# Patient Record
Sex: Female | Born: 1970 | Race: Black or African American | Hispanic: No | Smoking: Never smoker
Health system: Southern US, Community
[De-identification: ages and names within clinical notes are randomized; demographics above are authoritative.]

## PROBLEM LIST (undated history)

## (undated) DIAGNOSIS — F4310 Post-traumatic stress disorder, unspecified: Secondary | ICD-10-CM

## (undated) DIAGNOSIS — T7840XA Allergy, unspecified, initial encounter: Secondary | ICD-10-CM

## (undated) DIAGNOSIS — R519 Headache, unspecified: Secondary | ICD-10-CM

## (undated) DIAGNOSIS — F419 Anxiety disorder, unspecified: Secondary | ICD-10-CM

## (undated) DIAGNOSIS — D649 Anemia, unspecified: Secondary | ICD-10-CM

## (undated) DIAGNOSIS — F329 Major depressive disorder, single episode, unspecified: Secondary | ICD-10-CM

## (undated) DIAGNOSIS — F32A Depression, unspecified: Secondary | ICD-10-CM

## (undated) DIAGNOSIS — E079 Disorder of thyroid, unspecified: Secondary | ICD-10-CM

## (undated) DIAGNOSIS — R51 Headache: Secondary | ICD-10-CM

## (undated) DIAGNOSIS — D259 Leiomyoma of uterus, unspecified: Secondary | ICD-10-CM

## (undated) DIAGNOSIS — I10 Essential (primary) hypertension: Secondary | ICD-10-CM

## (undated) DIAGNOSIS — M549 Dorsalgia, unspecified: Secondary | ICD-10-CM

## (undated) HISTORY — DX: Headache, unspecified: R51.9

## (undated) HISTORY — DX: Allergy, unspecified, initial encounter: T78.40XA

## (undated) HISTORY — DX: Leiomyoma of uterus, unspecified: D25.9

## (undated) HISTORY — DX: Anxiety disorder, unspecified: F41.9

## (undated) HISTORY — PX: BREAST BIOPSY: SHX20

## (undated) HISTORY — DX: Depression, unspecified: F32.A

## (undated) HISTORY — DX: Disorder of thyroid, unspecified: E07.9

## (undated) HISTORY — DX: Anemia, unspecified: D64.9

## (undated) HISTORY — PX: ENDOMETRIAL ABLATION: SHX621

## (undated) HISTORY — DX: Headache: R51

## (undated) HISTORY — DX: Major depressive disorder, single episode, unspecified: F32.9

## (undated) HISTORY — DX: Dorsalgia, unspecified: M54.9

---

## 1992-04-02 DIAGNOSIS — M549 Dorsalgia, unspecified: Secondary | ICD-10-CM

## 1992-04-02 HISTORY — DX: Dorsalgia, unspecified: M54.9

## 1999-08-01 HISTORY — PX: THYROIDECTOMY: SHX17

## 2010-07-06 ENCOUNTER — Emergency Department (HOSPITAL_COMMUNITY): Payer: Non-veteran care

## 2010-07-06 ENCOUNTER — Emergency Department (HOSPITAL_COMMUNITY)
Admission: EM | Admit: 2010-07-06 | Discharge: 2010-07-06 | Disposition: A | Discharge status: Home / Self Care | Payer: Non-veteran care | Attending: Emergency Medicine | Admitting: Emergency Medicine

## 2010-07-06 DIAGNOSIS — I10 Essential (primary) hypertension: Secondary | ICD-10-CM | POA: Insufficient documentation

## 2010-07-06 DIAGNOSIS — M25559 Pain in unspecified hip: Secondary | ICD-10-CM | POA: Insufficient documentation

## 2010-07-06 LAB — POCT I-STAT, CHEM 8
BUN: 8 mg/dL (ref 6–23)
Calcium, Ion: 1.2 mmol/L (ref 1.12–1.32)
Chloride: 103 mEq/L (ref 96–112)
Creatinine, Ser: 0.8 mg/dL (ref 0.4–1.2)
Glucose, Bld: 102 mg/dL — ABNORMAL HIGH (ref 70–99)
HCT: 39 % (ref 36.0–46.0)
Hemoglobin: 13.3 g/dL (ref 12.0–15.0)
Potassium: 3.4 mEq/L — ABNORMAL LOW (ref 3.5–5.1)
Sodium: 140 meq/L (ref 135–145)
TCO2: 28 mmol/L (ref 0–100)

## 2010-07-06 LAB — URINALYSIS, ROUTINE W REFLEX MICROSCOPIC
Bilirubin Urine: NEGATIVE
Hgb urine dipstick: NEGATIVE
Ketones, ur: NEGATIVE mg/dL
Nitrite: NEGATIVE
Specific Gravity, Urine: 1.013 (ref 1.005–1.030)
pH: 6 (ref 5.0–8.0)

## 2010-07-06 LAB — POCT PREGNANCY, URINE: Preg Test, Ur: NEGATIVE

## 2010-07-07 ENCOUNTER — Inpatient Hospital Stay (HOSPITAL_COMMUNITY): Admission: EM | Admit: 2010-07-07 | Payer: Self-pay | Source: Home / Self Care

## 2010-07-07 ENCOUNTER — Ambulatory Visit (HOSPITAL_COMMUNITY)
Admission: EM | Admit: 2010-07-07 | Discharge: 2010-07-07 | Disposition: A | Discharge status: Home / Self Care | Payer: Non-veteran care | Attending: Emergency Medicine | Admitting: Emergency Medicine

## 2010-07-07 DIAGNOSIS — M79609 Pain in unspecified limb: Secondary | ICD-10-CM | POA: Insufficient documentation

## 2010-07-07 DIAGNOSIS — Z79899 Other long term (current) drug therapy: Secondary | ICD-10-CM | POA: Insufficient documentation

## 2010-07-07 DIAGNOSIS — I10 Essential (primary) hypertension: Secondary | ICD-10-CM | POA: Insufficient documentation

## 2013-03-26 ENCOUNTER — Emergency Department (HOSPITAL_COMMUNITY)
Admission: EM | Admit: 2013-03-26 | Discharge: 2013-03-26 | Disposition: A | Discharge status: Home / Self Care | Payer: Non-veteran care | Attending: Emergency Medicine | Admitting: Emergency Medicine

## 2013-03-26 ENCOUNTER — Encounter (HOSPITAL_COMMUNITY): Payer: Self-pay | Admitting: Emergency Medicine

## 2013-03-26 DIAGNOSIS — I10 Essential (primary) hypertension: Secondary | ICD-10-CM | POA: Insufficient documentation

## 2013-03-26 DIAGNOSIS — R509 Fever, unspecified: Secondary | ICD-10-CM | POA: Insufficient documentation

## 2013-03-26 DIAGNOSIS — Z8659 Personal history of other mental and behavioral disorders: Secondary | ICD-10-CM | POA: Insufficient documentation

## 2013-03-26 HISTORY — DX: Essential (primary) hypertension: I10

## 2013-03-26 HISTORY — DX: Post-traumatic stress disorder, unspecified: F43.10

## 2013-03-26 LAB — URINALYSIS, ROUTINE W REFLEX MICROSCOPIC
Protein, ur: NEGATIVE mg/dL
Urobilinogen, UA: 0.2 mg/dL (ref 0.0–1.0)

## 2013-03-26 LAB — URINE MICROSCOPIC-ADD ON

## 2013-03-26 NOTE — ED Notes (Signed)
Pt reports she was eating potato chips on Friday and felt like one "went down the wrong way, Im worried it went into my lungs."

## 2013-03-26 NOTE — ED Notes (Signed)
Pt in c/o fever since this afternoon with generalized body aches, denies other symptoms, pt took aleve approx 1.5 hours ago

## 2013-03-26 NOTE — ED Provider Notes (Signed)
CSN: [id number]     Arrival date & time 03/26/13  2022 History   First MD Initiated Contact with Patient 03/26/13 2053     Chief Complaint  Patient presents with  . Fever    HPI  Patient thought she was running a fever today which is a funeral. Check a temperature about 5 times today. She ultimately found one that was a 100.3. She has no other symptoms. She took some Aleve. She rechecked the temperature. It was normal. She still has no symptoms. Her father brought her here. Is not coughing. She has no dysuria. She has no sore throat. She has no URI symptoms. She has no skin rash. He is otherwise healthy  Past Medical History  Diagnosis Date  . Hypertension   . PTSD (post-traumatic stress disorder)    History reviewed. No pertinent past surgical history. History reviewed. No pertinent family history. History  Substance Use Topics  . Smoking status: Never Smoker   . Smokeless tobacco: Not on file  . Alcohol Use: Not on file   OB History   Grav Para Term Preterm Abortions TAB SAB Ect Mult Living                 Review of Systems  Constitutional: Positive for fever. Negative for chills, diaphoresis, appetite change and fatigue.  HENT: Negative for mouth sores, sore throat and trouble swallowing.   Eyes: Negative for visual disturbance.  Respiratory: Negative for cough, chest tightness, shortness of breath and wheezing.   Cardiovascular: Negative for chest pain.  Gastrointestinal: Negative for nausea, vomiting, abdominal pain, diarrhea and abdominal distention.  Endocrine: Negative for polydipsia, polyphagia and polyuria.  Genitourinary: Negative for dysuria, frequency and hematuria.  Musculoskeletal: Negative for gait problem.  Skin: Negative for color change, pallor and rash.  Neurological: Negative for dizziness, syncope, light-headedness and headaches.  Hematological: Does not bruise/bleed easily.  Psychiatric/Behavioral: Negative for behavioral problems and confusion.     Allergies  Citrus  Home Medications   Current Outpatient Rx  Name  Route  Sig  Dispense  Refill  . naproxen sodium (ANAPROX) 220 MG tablet   Oral   Take 220 mg by mouth 2 (two) times daily as needed (pain or fever).          BP 134/71  Pulse 94  Temp(Src) 99.4 F (37.4 C) (Oral)  Resp 19  SpO2 98% Physical Exam  Constitutional: She is oriented to person, place, and time. She appears well-developed and well-nourished. No distress.  HENT:  Head: Normocephalic.  Eyes: Conjunctivae are normal. Pupils are equal, round, and reactive to light. No scleral icterus.  Neck: Normal range of motion. Neck supple. No thyromegaly present.  Cardiovascular: Normal rate and regular rhythm.  Exam reveals no gallop and no friction rub.   No murmur heard. Pulmonary/Chest: Effort normal and breath sounds normal. No respiratory distress. She has no wheezes. She has no rales.  Abdominal: Soft. Bowel sounds are normal. She exhibits no distension. There is no tenderness. There is no rebound.  Musculoskeletal: Normal range of motion.  Neurological: She is alert and oriented to person, place, and time.  Skin: Skin is warm and dry. No rash noted.  Psychiatric: She has a normal mood and affect. Her behavior is normal.    ED Course  Procedures (including critical care time) Labs Review Labs Reviewed  URINALYSIS, ROUTINE W REFLEX MICROSCOPIC - Abnormal; Notable for the following:    APPearance CLOUDY (*)    Hgb urine dipstick LARGE (*)  Leukocytes, UA MODERATE (*)    All other components within normal limits  URINE MICROSCOPIC-ADD ON - Abnormal; Notable for the following:    Squamous Epithelial / LPF FEW (*)    Bacteria, UA FEW (*)    All other components within normal limits  URINE CULTURE   Imaging Review No results found.  EKG Interpretation   None       MDM   1. Fever    His mother urine. She is on her menses now. She is asymptomatic I don't think any additional studies are  indicated now. She has not traveled. There is no seasonal fluid or area now. Initial symptoms that should recheck here with primary care.    Roney Marion, MD 03/26/13 2234

## 2013-03-26 NOTE — ED Notes (Signed)
Temperature was 101.8 per pt report about 1.5 ago prior to taking aleve.

## 2013-03-28 LAB — URINE CULTURE

## 2014-09-06 ENCOUNTER — Emergency Department (HOSPITAL_COMMUNITY): Payer: Non-veteran care

## 2014-09-06 ENCOUNTER — Emergency Department (HOSPITAL_COMMUNITY)
Admission: EM | Admit: 2014-09-06 | Discharge: 2014-09-06 | Disposition: A | Discharge status: Home / Self Care | Payer: Non-veteran care | Attending: Emergency Medicine | Admitting: Emergency Medicine

## 2014-09-06 ENCOUNTER — Encounter (HOSPITAL_COMMUNITY): Payer: Self-pay | Admitting: Emergency Medicine

## 2014-09-06 DIAGNOSIS — R1031 Right lower quadrant pain: Secondary | ICD-10-CM | POA: Diagnosis present

## 2014-09-06 DIAGNOSIS — I10 Essential (primary) hypertension: Secondary | ICD-10-CM | POA: Diagnosis not present

## 2014-09-06 DIAGNOSIS — N760 Acute vaginitis: Secondary | ICD-10-CM | POA: Diagnosis not present

## 2014-09-06 DIAGNOSIS — R102 Pelvic and perineal pain: Secondary | ICD-10-CM

## 2014-09-06 DIAGNOSIS — B9689 Other specified bacterial agents as the cause of diseases classified elsewhere: Secondary | ICD-10-CM

## 2014-09-06 DIAGNOSIS — F4310 Post-traumatic stress disorder, unspecified: Secondary | ICD-10-CM | POA: Insufficient documentation

## 2014-09-06 DIAGNOSIS — Z792 Long term (current) use of antibiotics: Secondary | ICD-10-CM | POA: Insufficient documentation

## 2014-09-06 DIAGNOSIS — Z3202 Encounter for pregnancy test, result negative: Secondary | ICD-10-CM | POA: Insufficient documentation

## 2014-09-06 DIAGNOSIS — Z79899 Other long term (current) drug therapy: Secondary | ICD-10-CM | POA: Insufficient documentation

## 2014-09-06 LAB — URINALYSIS, ROUTINE W REFLEX MICROSCOPIC
BILIRUBIN URINE: NEGATIVE
GLUCOSE, UA: NEGATIVE mg/dL
Hgb urine dipstick: NEGATIVE
Ketones, ur: NEGATIVE mg/dL
Leukocytes, UA: NEGATIVE
Nitrite: NEGATIVE
PH: 5.5 (ref 5.0–8.0)
Protein, ur: NEGATIVE mg/dL
Specific Gravity, Urine: 1.01 (ref 1.005–1.030)
Urobilinogen, UA: 0.2 mg/dL (ref 0.0–1.0)

## 2014-09-06 LAB — COMPREHENSIVE METABOLIC PANEL
ALBUMIN: 3.8 g/dL (ref 3.5–5.2)
ALK PHOS: 36 U/L — AB (ref 39–117)
ALT: 32 U/L (ref 0–35)
AST: 28 U/L (ref 0–37)
Anion gap: 7 (ref 5–15)
BILIRUBIN TOTAL: 0.8 mg/dL (ref 0.3–1.2)
BUN: 8 mg/dL (ref 6–23)
CHLORIDE: 105 mmol/L (ref 96–112)
CO2: 27 mmol/L (ref 19–32)
Calcium: 9.1 mg/dL (ref 8.4–10.5)
Creatinine, Ser: 0.8 mg/dL (ref 0.50–1.10)
GFR calc Af Amer: >90 mL/min (ref >90)
GFR calc non Af Amer: 88 mL/min — ABNORMAL LOW (ref >90)
Glucose, Bld: 104 mg/dL — ABNORMAL HIGH (ref 70–99)
POTASSIUM: 3.4 mmol/L — AB (ref 3.5–5.1)
Sodium: 139 mmol/L (ref 135–145)
Total Protein: 7.1 g/dL (ref 6.0–8.3)

## 2014-09-06 LAB — CBC WITH DIFFERENTIAL/PLATELET
Basophils Absolute: 0 10*3/uL (ref 0.0–0.1)
Basophils Relative: 0 % (ref 0–1)
Eosinophils Absolute: 0.1 10*3/uL (ref 0.0–0.7)
Eosinophils Relative: 1 % (ref 0–5)
HCT: 36.3 % (ref 36.0–46.0)
Hemoglobin: 12.7 g/dL (ref 12.0–15.0)
LYMPHS ABS: 1.4 10*3/uL (ref 0.7–4.0)
LYMPHS PCT: 20 % (ref 12–46)
MCH: 29.1 pg (ref 26.0–34.0)
MCHC: 35 g/dL (ref 30.0–36.0)
MCV: 83.1 fL (ref 78.0–100.0)
MONOS PCT: 7 % (ref 3–12)
Monocytes Absolute: 0.5 10*3/uL (ref 0.1–1.0)
NEUTROS ABS: 5 10*3/uL (ref 1.7–7.7)
NEUTROS PCT: 72 % (ref 43–77)
PLATELETS: ADEQUATE 10*3/uL (ref 150–400)
RBC: 4.37 MIL/uL (ref 3.87–5.11)
RDW: 13 % (ref 11.5–15.5)
SMEAR REVIEW: ADEQUATE
WBC: 6.9 10*3/uL (ref 4.0–10.5)

## 2014-09-06 LAB — PREGNANCY, URINE: Preg Test, Ur: NEGATIVE

## 2014-09-06 LAB — LIPASE, BLOOD: Lipase: 19 U/L (ref 11–59)

## 2014-09-06 LAB — WET PREP, GENITAL
Trich, Wet Prep: NONE SEEN
WBC WET PREP: NONE SEEN
YEAST WET PREP: NONE SEEN

## 2014-09-06 MED ORDER — SODIUM CHLORIDE 0.9 % IV SOLN
INTRAVENOUS | Status: DC
  Administered 2014-09-06: 08:00:00 via INTRAVENOUS

## 2014-09-06 MED ORDER — NAPROXEN 250 MG PO TABS
250.0000 mg | ORAL_TABLET | Freq: Two times a day (BID) | ORAL | Status: DC | PRN
Start: 2014-09-06 — End: 2019-04-01

## 2014-09-06 MED ORDER — IOHEXOL 300 MG/ML  SOLN
25.0000 mL | Freq: Once | INTRAMUSCULAR | Status: AC | PRN
  Administered 2014-09-06: 25 mL via ORAL

## 2014-09-06 MED ORDER — MORPHINE SULFATE 4 MG/ML IJ SOLN
4.0000 mg | INTRAMUSCULAR | Status: AC | PRN
  Administered 2014-09-06 (×2): 4 mg via INTRAVENOUS
  Filled 2014-09-06 (×2): qty 1

## 2014-09-06 MED ORDER — HYDROCODONE-ACETAMINOPHEN 5-325 MG PO TABS: ORAL_TABLET | ORAL | Status: DC

## 2014-09-06 MED ORDER — IOHEXOL 300 MG/ML  SOLN
100.0000 mL | Freq: Once | INTRAMUSCULAR | Status: AC | PRN
  Administered 2014-09-06: 100 mL via INTRAVENOUS

## 2014-09-06 MED ORDER — ONDANSETRON HCL 4 MG/2ML IJ SOLN
4.0000 mg | INTRAMUSCULAR | Status: AC | PRN
  Administered 2014-09-06 (×2): 4 mg via INTRAVENOUS
  Filled 2014-09-06 (×2): qty 2

## 2014-09-06 MED ORDER — METRONIDAZOLE 500 MG PO TABS: 500.0000 mg | ORAL_TABLET | Freq: Two times a day (BID) | ORAL | Status: DC

## 2014-09-06 NOTE — ED Notes (Signed)
Patient c/o RLQ pain since last night and has felt "gassy" and had diarrhea last night.

## 2014-09-06 NOTE — ED Provider Notes (Signed)
CSN: [id number]     Arrival date & time 09/06/14  9030 History   First MD Initiated Contact with Patient 09/06/14 [phone]     Chief Complaint  Patient presents with  . Abdominal Pain      HPI Pt was seen at 0720.  Per pt, c/o gradual onset and persistence of constant RLQ abd "pain" since last night. Has been associated with nausea and multiple intermittent episodes of diarrhea. Describes the abd pain as "bloating" and "gassy." Pt states she experienced similar symptoms last week which lasted for several days before spontaneously resolving. Pt states "it came back again last night worse than before."  Denies vomiting, no fevers, no back pain, no rash, no CP/SOB, no black or blood in stools.       Past Medical History  Diagnosis Date  . Hypertension   . PTSD (post-traumatic stress disorder)    Past Surgical History  Procedure Laterality Date  . Thyroidectomy      History  Substance Use Topics  . Smoking status: Never Smoker   . Smokeless tobacco: Not on file  . Alcohol Use: No    Review of Systems ROS: Statement: All systems negative except as marked or noted in the HPI; Constitutional: Negative for fever and chills. ; ; Eyes: Negative for eye pain, redness and discharge. ; ; ENMT: Negative for ear pain, hoarseness, nasal congestion, sinus pressure and sore throat. ; ; Cardiovascular: Negative for chest pain, palpitations, diaphoresis, dyspnea and peripheral edema. ; ; Respiratory: Negative for cough, wheezing and stridor. ; ; Gastrointestinal: +nausea, diarrhea, abd pain. Negative for vomiting, blood in stool, hematemesis, jaundice and rectal bleeding. . ; ; Genitourinary: Negative for dysuria, flank pain and hematuria. ; ; Musculoskeletal: Negative for back pain and neck pain. Negative for swelling and trauma.; ; Skin: Negative for pruritus, rash, abrasions, blisters, bruising and skin lesion.; ; Neuro: Negative for headache, lightheadedness and neck stiffness. Negative for weakness,  altered level of consciousness , altered mental status, extremity weakness, paresthesias, involuntary movement, seizure and syncope.      Allergies  Citrus  Home Medications   Prior to Admission medications   Medication Sig Start Date End Date Taking? Authorizing Provider  Cholecalciferol (VITAMIN D) 2000 UNITS tablet Take 2,000 Units by mouth daily.    Historical Provider, MD  clindamycin (CLEOCIN T) 1 % SWAB Apply 1 application topically 2 (two) times daily.    Historical Provider, MD  ferrous sulfate 325 (65 FE) MG tablet Take 325 mg by mouth daily with breakfast.    Historical Provider, MD  FLUOCINOLONE ACETONIDE BODY 0.01 % OIL Apply 1 application topically daily.    Historical Provider, MD  fluticasone (FLONASE) 50 MCG/ACT nasal spray Place 2 sprays into the nose daily as needed for rhinitis.    Historical Provider, MD  ketoconazole (NIZORAL) 2 % shampoo Apply 1 application topically every 14 (fourteen) days.    Historical Provider, MD  ketotifen (ZADITOR) 0.025 % ophthalmic solution Place 1 drop into both eyes daily as needed (allergies).    Historical Provider, MD  Multiple Vitamin (MULTIVITAMIN WITH MINERALS) TABS tablet Take 1 tablet by mouth daily.    Historical Provider, MD  naproxen sodium (ANAPROX) 220 MG tablet Take 220 mg by mouth 2 (two) times daily as needed (pain or fever).    Historical Provider, MD  propranolol (INDERAL) 80 MG tablet Take 80 mg by mouth daily as needed (anxiety).    Historical Provider, MD  pseudoephedrine (SUDAFED) 30 MG tablet Take  30 mg by mouth every 4 (four) hours as needed for congestion.    Historical Provider, MD  SUMAtriptan (IMITREX) 25 MG tablet Take 25 mg by mouth every 2 (two) hours as needed for migraine. May repeat in 2 hours if headache persists or recurs.    Historical Provider, MD  tretinoin (RETIN-A) 0.05 % cream Apply 1 application topically at bedtime.    Historical Provider, MD  triamterene-hydrochlorothiazide (MAXZIDE) 75-50 MG per  tablet Take 0.5 tablets by mouth daily.    Historical Provider, MD  venlafaxine (EFFEXOR) 37.5 MG tablet Take 37.5 mg by mouth daily.    Historical Provider, MD   BP 138/87 mmHg  Pulse 86  Temp(Src) 98.4 F (36.9 C) (Oral)  Resp 16  Ht 5\' 9"  (1.753 m)  Wt 241 lb (109.317 kg)  BMI 35.57 kg/m2  SpO2 100%  LMP 08/15/2014 (Approximate) Physical Exam 0725: Physical examination:  Nursing notes reviewed; Vital signs and O2 SAT reviewed;  Constitutional: Well developed, Well nourished, Well hydrated, Uncomfortable appearing.; Head:  Normocephalic, atraumatic; Eyes: EOMI, PERRL, No scleral icterus; ENMT: Mouth and pharynx normal, Mucous membranes moist; Neck: Supple, Full range of motion, No lymphadenopathy; Cardiovascular: Regular rate and rhythm, No murmur, rub, or gallop; Respiratory: Breath sounds clear & equal bilaterally, No rales, rhonchi, wheezes.  Speaking full sentences with ease, Normal respiratory effort/excursion; Chest: Nontender, Movement normal; Abdomen: Soft,+RLQ tenderness to palp. Nondistended, Normal bowel sounds; Genitourinary: No CVA tenderness. Pelvic exam performed with permission of pt and female ED tech assist during exam.  External genitalia w/o lesions. Vaginal vault with thick white discharge.  Cervix w/o lesions, not friable, GC/chlam and wet prep obtained and sent to lab.  Bimanual exam w/o CMT, uterine or adnexal tenderness.; Extremities: Pulses normal, No tenderness, No edema, No calf edema or asymmetry.; Neuro: AA&Ox3, Major CN grossly intact.  Speech clear. No gross focal motor or sensory deficits in extremities.; Skin: Color normal, Warm, Dry.   ED Course  Procedures     EKG Interpretation None      MDM  MDM Reviewed: previous chart, nursing note and vitals Reviewed previous: labs Interpretation: labs and CT scan     Results for orders placed or performed during the hospital encounter of 09/06/14  Wet prep, genital  Result Value Ref Range   Yeast Wet  Prep HPF POC NONE SEEN NONE SEEN   Trich, Wet Prep NONE SEEN NONE SEEN   Clue Cells Wet Prep HPF POC RARE (A) NONE SEEN   WBC, Wet Prep HPF POC NONE SEEN NONE SEEN  CBC with Differential  Result Value Ref Range   WBC 6.9 4.0 - 10.5 K/uL   RBC 4.37 3.87 - 5.11 MIL/uL   Hemoglobin 12.7 12.0 - 15.0 g/dL   HCT 36.3 36.0 - 46.0 %   MCV 83.1 78.0 - 100.0 fL   MCH 29.1 26.0 - 34.0 pg   MCHC 35.0 30.0 - 36.0 g/dL   RDW 13.0 11.5 - 15.5 %   Platelets  150 - 400 K/uL    PLATELET CLUMPS NOTED ON SMEAR, COUNT APPEARS ADEQUATE   Neutrophils Relative % 72 43 - 77 %   Neutro Abs 5.0 1.7 - 7.7 K/uL   Lymphocytes Relative 20 12 - 46 %   Lymphs Abs 1.4 0.7 - 4.0 K/uL   Monocytes Relative 7 3 - 12 %   Monocytes Absolute 0.5 0.1 - 1.0 K/uL   Eosinophils Relative 1 0 - 5 %   Eosinophils Absolute 0.1 0.0 -  0.7 K/uL   Basophils Relative 0 0 - 1 %   Basophils Absolute 0.0 0.0 - 0.1 K/uL   Smear Review      PLATELET CLUMPS NOTED ON SMEAR, COUNT APPEARS ADEQUATE  Comprehensive metabolic panel  Result Value Ref Range   Sodium 139 135 - 145 mmol/L   Potassium 3.4 (L) 3.5 - 5.1 mmol/L   Chloride 105 96 - 112 mmol/L   CO2 27 19 - 32 mmol/L   Glucose, Bld 104 (H) 70 - 99 mg/dL   BUN 8 6 - 23 mg/dL   Creatinine, Ser 0.80 0.50 - 1.10 mg/dL   Calcium 9.1 8.4 - 10.5 mg/dL   Total Protein 7.1 6.0 - 8.3 g/dL   Albumin 3.8 3.5 - 5.2 g/dL   AST 28 0 - 37 U/L   ALT 32 0 - 35 U/L   Alkaline Phosphatase 36 (L) 39 - 117 U/L   Total Bilirubin 0.8 0.3 - 1.2 mg/dL   GFR calc non Af Amer 88 (L) >90 mL/min   GFR calc Af Amer >90 >90 mL/min   Anion gap 7 5 - 15  Lipase, blood  Result Value Ref Range   Lipase 19 11 - 59 U/L  Pregnancy, urine  Result Value Ref Range   Preg Test, Ur NEGATIVE NEGATIVE  Urinalysis, Routine w reflex microscopic  Result Value Ref Range   Color, Urine YELLOW YELLOW   APPearance CLEAR CLEAR   Specific Gravity, Urine 1.010 1.005 - 1.030   pH 5.5 5.0 - 8.0   Glucose, UA NEGATIVE  NEGATIVE mg/dL   Hgb urine dipstick NEGATIVE NEGATIVE   Bilirubin Urine NEGATIVE NEGATIVE   Ketones, ur NEGATIVE NEGATIVE mg/dL   Protein, ur NEGATIVE NEGATIVE mg/dL   Urobilinogen, UA 0.2 0.0 - 1.0 mg/dL   Nitrite NEGATIVE NEGATIVE   Leukocytes, UA NEGATIVE NEGATIVE   Ct Abdomen Pelvis W Contrast 09/06/2014   CLINICAL DATA:  Three-day history of right lower quadrant pain.  EXAM: CT ABDOMEN AND PELVIS WITH CONTRAST  TECHNIQUE: Multidetector CT imaging of the abdomen and pelvis was performed using the standard protocol following bolus administration of intravenous contrast. Oral contrast was also administered.  CONTRAST:  70mL OMNIPAQUE IOHEXOL 300 MG/ML SOLN, 162mL OMNIPAQUE IOHEXOL 300 MG/ML SOLN  COMPARISON:  None.  FINDINGS: Lung bases are clear.  No focal liver lesions are identified. The gallbladder wall is not appreciably thickened. There is no biliary duct dilatation.  Spleen, pancreas, and adrenals appear normal. Kidneys bilaterally show no mass or hydronephrosis on either side. There is no renal or ureteral calculus on either side.  In the pelvis, urinary bladder is midline with normal wall thickness. The uterus is irregular in contour. There are multiple masses within the uterus. The largest mass is in the anterior inferior fundal region measuring 3.7 x 3.7 x 3.3 cm. These changes are felt to be due to leiomyomas within the uterus. There is no pelvic mass outside of the uterus. There is a small amount of free fluid in the cul-de-sac region. The appendix region appears normal.  There is no appreciable bowel obstruction. No free air or portal venous air. There is no adenopathy or abscess in the abdomen or pelvis. There is a prominent hem azygous vein on the left to the level of the left renal vein, an anatomic variant. There is no abdominal aortic aneurysm. There are no blastic or lytic bone lesions. A small bone island in the L1 vertebral body is noted incidentally.  IMPRESSION: 1  enlarged  leiomyomatous uterus. Small amount of free fluid in cul-de-sac. Suspect recent ovarian cyst rupture.  Appendiceal region appears unremarkable. No bowel obstruction. No abscess. No renal or ureteral calculus. No hydronephrosis.   Electronically Signed   By: Lowella Grip III M.D.   On: 09/06/2014 08:39   [place] Art/ven Flow Abd Pelv Doppler 09/06/2014   CLINICAL DATA:  Pelvic pain for 3 days, primarily on the right  EXAM: TRANSABDOMINAL AND TRANSVAGINAL ULTRASOUND OF PELVIS  DOPPLER ULTRASOUND OF OVARIES  TECHNIQUE: Study was performed transabdominally to optimize pelvic field of view evaluation and transvaginally to optimize internal visceral architecture evaluation.  Color and duplex Doppler ultrasound was utilized to evaluate blood flow to the ovaries.  COMPARISON:  CT abdomen and pelvis September 06, 2014  FINDINGS: Uterus  Measurements: 11.4 x 8.1 x 6.1 cm. There is a mass arising from the right side of the uterine fundus measuring 3.2 x 2.7 x 2.4 cm. There is a mass measuring 3.8 x 3.7 x 4.1 cm arising from the leftward mid fundal region. These masses are felt to represent uterine leiomyomas. The overall echotexture of the uterus is inhomogeneous suggesting diffuse leiomyomatous change.  Endometrium  Thickness: 7 mm.  No focal abnormality visualized.  Right ovary  Measurements: 3.3 x 3.0 x 2.3 cm. Normal appearance/no adnexal mass beyond a dominant follicle measuring 1.4 x 1.3 cm.  Left ovary  Measurements: 4.1 x 2.0 x 2.7 cm. Normal appearance/no adnexal mass.  Pulsed Doppler evaluation of both ovaries demonstrates normal low-resistance arterial and venous waveforms. Peak systolic velocity for each ovary is 10 cm/sec.  Other findings  Small amount of free fluid.  IMPRESSION: Enlarged leiomyomatous uterus. No ovarian mass beyond dominant follicle right ovary. No ovarian torsion on either side. Small amount of free pelvic fluid could be indicative of recent ovarian cyst rupture.   Electronically Signed   By: Lowella Grip III M.D.   On: 09/06/2014 10:31    1140:  Will tx for BV. Pelvic pain likely d/t recent ovarian cyst rupture; tx symptomatically. Dx and testing d/w pt and family.  Questions answered.  Verb understanding, agreeable to d/c home with outpt f/u.   Francine Graven, DO 09/09/14 1646

## 2014-09-06 NOTE — Discharge Instructions (Signed)
°Emergency Department Resource Guide °1) Find a Doctor and Pay Out of Pocket °Although you won't have to find out who is covered by your insurance plan, it is a good idea to ask around and get recommendations. You will then need to call the office and see if the doctor you have chosen will accept you as a new patient and what types of options they offer for patients who are self-pay. Some doctors offer discounts or will set up payment plans for their patients who do not have insurance, but you will need to ask so you aren't surprised when you get to your appointment. ° °2) Contact Your Local Health Department °Not all health departments have doctors that can see patients for sick visits, but many do, so it is worth a call to see if yours does. If you don't know where your local health department is, you can check in your phone book. The CDC also has a tool to help you locate your state's health department, and many state websites also have listings of all of their local health departments. ° °3) Find a Walk-in Clinic °If your illness is not likely to be very severe or complicated, you may want to try a walk in clinic. These are popping up all over the country in pharmacies, drugstores, and shopping centers. They're usually staffed by nurse practitioners or physician assistants that have been trained to treat common illnesses and complaints. They're usually fairly quick and inexpensive. However, if you have serious medical issues or chronic medical problems, these are probably not your best option. ° °No Primary Care Doctor: °- Call Health Connect at  832-8000 - they can help you locate a primary care doctor that  accepts your insurance, provides certain services, etc. °- Physician Referral Service- 1-800-533-3463 ° °Chronic Pain Problems: °Organization         Address  Phone   Notes  °[facility]  (336) 297-2271 Patients need to be referred by their primary care doctor.  ° °Medication  Assistance: °Organization         Address  Phone   Notes  °[place] [facility] [address] °[place], [place] [address] (336) 641-8030 --Must be a resident of [place] °-- Must have NO insurance coverage whatsoever (no Medicaid/ Medicare, etc.) °-- The pt. MUST have a primary care doctor that directs their care regularly and follows them in the community °  °[facility]  (866) 331-1348   °[employer]  (888) 892-1162   ° °Agencies that provide inexpensive medical care: °Organization         Address  Phone   Notes  °[facility]  (336) 832-8035   °[facility]    (336) 832-7272   °[facility] [address] °[place], [place] [address] (336) 832-4777   °[facility] [address], °[place] (336) 271-4999   °[facility]    (336) 373-0678   °[facility]    (336) 272-1050   °[facility] ° [address], [place] Phone:  (336) 832-4444, Fax:  (336) 832-4440 Hours of Operation:  9 am - 6 pm, M-F.  Also accepts Medicaid/Medicare and self-pay.  °[facility] ° [address],  Phone: (336) 832-3150, Fax: (336) [phone]. Hours of Operation:  8:30 am - 5:30 pm, M-F.  Also accepts Medicaid and self-pay.  °[facility] [place] [address], [place] Phone: (336) 878-6027   °[facility] [address], [place], [place] (336)723-1848, Ext. 123 Mondays & Thursdays: 7-9 AM.  First 15 patients are seen on a first come, first serve basis. °  ° °Medicaid-accepting [place] Providers: ° °Organization         Address  Phone   Notes  °[facility] [address], [place] (336) 641-2100 Also accepts self-pay patients.  °[facility] [address], [place] ° (336) 856-9996   °[facility] [address], [place]  (336) 288-8857   °[facility] [address] [place] [address], [place] (336) 299-7000   °Veita Bland [address], [place]  ° (336) 373-1557 Only accepts [place] Access Medicaid patients after they have their name applied to their card.  ° °Self-Pay (no insurance) in [place]: ° °Organization         Address  Phone   Notes  °Sickle Cell Patients, [facility] [address], [place] (336) 832-1970   °[facility] [address], [place] (336) 832-4400   °[facility] ° [address],  (336) 992-4800   °[facility]/Dr. Osei-Bonsu ° [address] [place] [address], [place] or [address], [place] (336) [phone] Phone number for both [place] and [place] locations is the same.  °[facility] [address], [place] (336) 299-0000   °[facility] [address] [place] [address], [place] or [address] (336) 852-7530 °(336) 878-2260   °[facility] [address], [place] (336) 350-1642, phone; (336) 294-5005, fax Sees patients 1st and 3rd Saturday of every month.  Must not qualify for public or private insurance (i.e. Medicaid, Medicare, [place] Health Choice, Veterans' Benefits) • Household income should be no more than 200% of the poverty level •The clinic cannot treat you if you are pregnant or think you are pregnant • Sexually transmitted diseases are not treated at the clinic.  ° ° °Dental Care: °Organization         Address  Phone  Notes  °[place] [facility] [address],  (336) 641-6152 Accepts children up to age 21 who are enrolled in Medicaid or [place] Health Choice; pregnant women with a Medicaid card; and children who have applied for Medicaid or [place] Health Choice, but were declined, whose parents can pay a reduced fee at time of service.  °[place]  [facility] [place]  [address], [place] (336) 641-7733 Accepts children up to age 21 who are enrolled in Medicaid or  Health Choice; pregnant women with a Medicaid card; and children who have applied for Medicaid or  Health Choice, but were declined, whose parents can pay a reduced fee at time of service.  °[facility] ° [address],  (336) 641-4533 Patients are seen by appointment only. Walk-ins are not accepted. [facility] will see patients 18 years of age and older. °Monday - Tuesday (8am-5pm) °Most Wednesdays (8:30-5pm) °$30 per visit, cash only  °[facility] ° [address], [place] (336) 641-4533 Patients are seen by appointment only. Walk-ins are not accepted. [facility] will see patients 18 years of age and older. °One   Wednesday Evening (Monthly: Volunteer Based).  $30 per visit, cash only  °[facility]  (919) 537-3737 for adults; Children under age 4, call Graduate Pediatric Dentistry at ([phone]) [phone]. Children aged 4-14, please call ([phone]) [phone] to request a pediatric application. ° Dental services are provided in all areas of dental care including fillings, crowns and bridges, complete and partial dentures, implants, gum treatment, root canals, and extractions. Preventive care is also provided. Treatment is provided to both adults and children. °Patients are selected via a lottery and there is often a waiting list. °  °[facility] [address], °[place] ° (336) 763-8833 www.drcivils.[email or web address] °  °[facility] [address], [place], [place] (336)723-1848, Ext. 123 Second and Fourth Thursday of each month, opens at 6:30 AM; Clinic ends at 9 AM.  Patients are seen on a first-come first-served basis, and a limited number are seen during each clinic.  ° °[facility] ° [address], [place], [place] (336) 723-7904    Eligibility Requirements °You must have lived in [place], [place], or [place] counties for at least the last three months. °  You cannot be eligible for state or federal sponsored healthcare insurance, including Veterans Administration, Medicaid, or Medicare. °  You generally cannot be eligible for healthcare insurance through your employer.  °  How to apply: °Eligibility screenings are held every Tuesday and Wednesday afternoon from 1:00 pm until 4:00 pm. You do not need an appointment for the interview!  °[facility] [address], [place], [place] 336-631-2330   °[facility]  336-342-8273   °[place]  336-703-3100   ° [facility]  336-570-6415   ° °Behavioral Health Resources in the Community: °Intensive Outpatient Programs °Organization         Address  Phone  Notes  °[facility] [address], [place], [place] 336-878-6098   °[facility] [address], [place], [place] 336-832-9800   °[facility] [address], [place], [place] ° 336-882-2125   °[facility] [address],  °[place], [place] 1-800-853-5163 or 336-641-4981   °Substance Abuse Resources °Organization         Address  Phone  Notes  °[facility]  336-882-2125   °[facility]  336-784-9470   °[facility]  336-285-9073   °[facility]  336-845-3988   °Residential & Outpatient Substance Abuse Program  1-800-659-3381   °Psychological Services °Organization         Address  Phone  Notes  °[facility]  336- 832-9600   °[facility]  336- 378-7881   °[facility] [address], [place] 1-800-853-5163 or 336-641-4981   ° °Mobile Crisis Teams °Organization         Address  Phone  Notes  °[facility], Mobile Crisis Care Unit  1-877-626-1772   °Assertive °[facility] ° [address]  [place], [place] 336-834-9664   °Sharon DeEsch [address] °[place] 336-554-5454   ° °Self-Help/Support Groups °Organization         Address  Phone             Notes  °[facility]  - variety of support groups  336- 373-1402 Call for more information  °Narcotics Anonymous (NA), [facility] [address], °[place]  2 meetings at this location  ° °  Residential Treatment Programs Organization         Address  Phone  Notes  [facility] [address]    [place]  1-210-283-0960   [facility]  [address] [place] [address], [place], [place]   [place], [place] [phone] Admissions: 8am-3pm M-F  [facility] [place] [address]    [place], [place] [phone]   [facility] [address] [place], [place], [place]   [facility] [address],  [place], [place]   [facility] Intensive Outpatient [address], [place], [place]   [facility] ([place] [address]  [place], [place] 1-805-885-1289 or (450) 430-8695   [facility] [address] [place], [place] Accepts Medicaid  [facility] [address],  [place] 1-(248) 361-8897 Substance Abuse/Addiction Treatment   [facility]         Address  Phone  Notes  [facility]  214-835-2437   Domenic Schwab, PhD [address] [place], [place]   (318)177-9222 or 573-441-7971   [place], [place] [phone]   [facility] [address], [place], [place] [phone] Insurance/Medicaid/sponsorship through [facility] [address]                                    [place], [place] (207)196-0576 [place] [address], [place] [phone]    Dr. Adele Schilder  ([phone]   [facility] Dept. 1) [address] [place] 2) [place] 3)  [address] [place] [address], [place] [phone]  [phone]   [place] [phone] or [phone] (After Hours)      Take the prescriptions as directed.  Apply moist heat or ice to the area(s) of discomfort, for 15 minutes at a time, several times per day for the next few days.  Do not fall asleep on a heating or ice pack.  Call your regular OB/GYN doctor today to schedule a follow up appointment this week.  Return to the Emergency Department immediately if worsening.

## 2014-09-07 LAB — GC/CHLAMYDIA PROBE AMP (~~LOC~~) NOT AT ARMC
Chlamydia: NEGATIVE
Neisseria Gonorrhea: NEGATIVE

## 2016-05-08 ENCOUNTER — Encounter: Payer: Self-pay | Admitting: Nurse Practitioner

## 2016-05-08 ENCOUNTER — Other Ambulatory Visit (INDEPENDENT_AMBULATORY_CARE_PROVIDER_SITE_OTHER): Payer: Medicare HMO

## 2016-05-08 ENCOUNTER — Ambulatory Visit (INDEPENDENT_AMBULATORY_CARE_PROVIDER_SITE_OTHER): Payer: Medicare HMO | Admitting: Nurse Practitioner

## 2016-05-08 VITALS — BP 120/84 | HR 72 | Temp 98.1°F | Ht 69.0 in | Wt 247.0 lb

## 2016-05-08 DIAGNOSIS — E890 Postprocedural hypothyroidism: Secondary | ICD-10-CM

## 2016-05-08 DIAGNOSIS — E1159 Type 2 diabetes mellitus with other circulatory complications: Secondary | ICD-10-CM | POA: Insufficient documentation

## 2016-05-08 DIAGNOSIS — I10 Essential (primary) hypertension: Secondary | ICD-10-CM | POA: Diagnosis not present

## 2016-05-08 DIAGNOSIS — Z0000 Encounter for general adult medical examination without abnormal findings: Secondary | ICD-10-CM | POA: Diagnosis not present

## 2016-05-08 DIAGNOSIS — F4323 Adjustment disorder with mixed anxiety and depressed mood: Secondary | ICD-10-CM | POA: Diagnosis not present

## 2016-05-08 DIAGNOSIS — Z9889 Other specified postprocedural states: Secondary | ICD-10-CM

## 2016-05-08 DIAGNOSIS — Z800 Family history of malignant neoplasm of digestive organs: Secondary | ICD-10-CM | POA: Insufficient documentation

## 2016-05-08 LAB — CBC WITH DIFFERENTIAL/PLATELET
Basophils Absolute: 0 10*3/uL (ref 0.0–0.1)
Basophils Relative: 0.7 % (ref 0.0–3.0)
Eosinophils Absolute: 0.1 10*3/uL (ref 0.0–0.7)
Eosinophils Relative: 2.2 % (ref 0.0–5.0)
HCT: 36.4 % (ref 36.0–46.0)
Hemoglobin: 12.4 g/dL (ref 12.0–15.0)
LYMPHS ABS: 1.4 10*3/uL (ref 0.7–4.0)
Lymphocytes Relative: 35.3 % (ref 12.0–46.0)
MCHC: 34 g/dL (ref 30.0–36.0)
MCV: 81.6 fl (ref 78.0–100.0)
MONO ABS: 0.3 10*3/uL (ref 0.1–1.0)
Monocytes Relative: 6.9 % (ref 3.0–12.0)
NEUTROS PCT: 54.9 % (ref 43.0–77.0)
Neutro Abs: 2.3 10*3/uL (ref 1.4–7.7)
Platelets: 151 10*3/uL (ref 150.0–400.0)
RBC: 4.46 Mil/uL (ref 3.87–5.11)
RDW: 14.5 % (ref 11.5–15.5)
WBC: 4.1 10*3/uL (ref 4.0–10.5)

## 2016-05-08 LAB — LIPID PANEL
CHOL/HDL RATIO: 2
Cholesterol: 203 mg/dL — ABNORMAL HIGH (ref 0–200)
HDL: 82.7 mg/dL (ref >39.00)
LDL Cholesterol: 107 mg/dL — ABNORMAL HIGH (ref 0–99)
NonHDL: 120.69
Triglycerides: 69 mg/dL (ref 0.0–149.0)
VLDL: 13.8 mg/dL (ref 0.0–40.0)

## 2016-05-08 LAB — COMPREHENSIVE METABOLIC PANEL
ALT: 13 U/L (ref 0–35)
AST: 15 U/L (ref 0–37)
Albumin: 3.8 g/dL (ref 3.5–5.2)
Alkaline Phosphatase: 39 U/L (ref 39–117)
BUN: 11 mg/dL (ref 6–23)
CO2: 31 meq/L (ref 19–32)
Calcium: 9.1 mg/dL (ref 8.4–10.5)
Chloride: 103 mEq/L (ref 96–112)
Creatinine, Ser: 0.78 mg/dL (ref 0.40–1.20)
GFR: 101.87 mL/min (ref >60.00)
GLUCOSE: 87 mg/dL (ref 70–99)
POTASSIUM: 3.4 meq/L — AB (ref 3.5–5.1)
SODIUM: 140 meq/L (ref 135–145)
TOTAL PROTEIN: 7 g/dL (ref 6.0–8.3)
Total Bilirubin: 0.5 mg/dL (ref 0.2–1.2)

## 2016-05-08 LAB — TSH: TSH: 1.51 u[IU]/mL (ref 0.35–4.50)

## 2016-05-08 LAB — HEMOGLOBIN A1C: Hgb A1c MFr Bld: 5.2 % (ref 4.6–6.5)

## 2016-05-08 MED ORDER — POTASSIUM CHLORIDE ER 10 MEQ PO TBCR: 10.0000 meq | EXTENDED_RELEASE_TABLET | Freq: Every day | ORAL | 3 refills | Status: DC

## 2016-05-08 NOTE — Patient Instructions (Signed)
Call 911 or Mobile crisis [phone] if develop suicide ideation or panic attack.
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
## 2016-05-08 NOTE — Progress Notes (Signed)
Pre visit review using our clinic review tool, if applicable. No additional management support is needed unless otherwise documented below in the visit note. 

## 2016-05-14 ENCOUNTER — Telehealth: Payer: Self-pay | Admitting: Nurse Practitioner

## 2016-05-14 NOTE — Telephone Encounter (Signed)
Spoke with pt,she said bloating/gas might be coming from something else, she will continue to monitor. Pt will take her potassium as direct along with food rich in potassium.

## 2016-05-14 NOTE — Telephone Encounter (Signed)
Patient is calling back. Rachel Bo had gave her the results back on the 8th. She understood she did pick up the medications. The only thing is that the medication is messing her stomach up. She would like to know if there is something she could do about that. Please follow up, Thank you

## 2016-05-30 ENCOUNTER — Encounter: Payer: Self-pay | Admitting: Gastroenterology

## 2016-06-05 ENCOUNTER — Encounter: Payer: Self-pay | Admitting: Nurse Practitioner

## 2016-06-05 ENCOUNTER — Ambulatory Visit (INDEPENDENT_AMBULATORY_CARE_PROVIDER_SITE_OTHER): Payer: Medicare HMO | Admitting: Nurse Practitioner

## 2016-06-05 ENCOUNTER — Other Ambulatory Visit (INDEPENDENT_AMBULATORY_CARE_PROVIDER_SITE_OTHER): Payer: Medicare HMO

## 2016-06-05 VITALS — BP 132/84 | HR 92 | Temp 98.0°F | Ht 69.0 in | Wt 251.0 lb

## 2016-06-05 DIAGNOSIS — I10 Essential (primary) hypertension: Secondary | ICD-10-CM

## 2016-06-05 DIAGNOSIS — F4323 Adjustment disorder with mixed anxiety and depressed mood: Secondary | ICD-10-CM

## 2016-06-05 DIAGNOSIS — E876 Hypokalemia: Secondary | ICD-10-CM

## 2016-06-05 DIAGNOSIS — R922 Inconclusive mammogram: Secondary | ICD-10-CM | POA: Insufficient documentation

## 2016-06-05 DIAGNOSIS — L659 Nonscarring hair loss, unspecified: Secondary | ICD-10-CM | POA: Insufficient documentation

## 2016-06-05 LAB — BASIC METABOLIC PANEL
BUN: 11 mg/dL (ref 6–23)
CHLORIDE: 104 meq/L (ref 96–112)
CO2: 33 mEq/L — ABNORMAL HIGH (ref 19–32)
Calcium: 8.6 mg/dL (ref 8.4–10.5)
Creatinine, Ser: 0.78 mg/dL (ref 0.40–1.20)
GFR: 101.84 mL/min (ref >60.00)
GLUCOSE: 93 mg/dL (ref 70–99)
Potassium: 3.6 mEq/L (ref 3.5–5.1)
Sodium: 141 mEq/L (ref 135–145)

## 2016-06-05 NOTE — Progress Notes (Signed)
Subjective:  Patient ID: Lisa Boyer, female    DOB: March 18, 1971  Age: 46 y.o. MRN: [id number]  CC: Follow-up (1 mo fu/alopecia?)   HPI HTN:  Stable, completed potassium supple as prescribed.  Adjustment disorder: Still attending support group. Describes mood as improved compared to 16month ago. Denies any SI or Hi.  Alopecia: [facility] with [facility]. Ongoing steriod injections. [facility]ending [facility]. She thinks this might be caused by an autoimmune disorder, but not sure. Does not know her family history since she was adopted. Just recently reconnected with biological family but has not been able to inquire about FH at this time.  Outpatient Medications Prior to Visit  Medication Sig Dispense Refill  . Cholecalciferol (VITAMIN D) 2000 UNITS tablet Take 2,000 Units by mouth 2 (two) times daily.     . ferrous sulfate 325 (65 FE) MG tablet Take 325 mg by mouth daily as needed (during menstrual cycle).     . fluticasone (FLONASE) 50 MCG/ACT nasal spray Place 2 sprays into the nose daily as needed for rhinitis.    Marland Kitchen ketoconazole (NIZORAL) 2 % shampoo Apply 1 application topically every 14 (fourteen) days.    Marland Kitchen ketotifen (ZADITOR) 0.025 % ophthalmic solution Place 1 drop into both eyes daily as needed (allergies).    . Multiple Vitamin (MULTIVITAMIN WITH MINERALS) TABS tablet Take 1 tablet by mouth daily.    . naproxen (NAPROSYN) 250 MG tablet Take 1 tablet (250 mg total) by mouth 2 (two) times daily as needed for mild pain or moderate pain (take with food). 14 tablet 0  . tretinoin (RETIN-A) 0.05 % cream Apply 1 application topically once a week.     . triamterene-hydrochlorothiazide (MAXZIDE) 75-50 MG per tablet Take 0.5 tablets by mouth daily.    . potassium chloride (K-DUR) 10 MEQ tablet Take 1 tablet (10 mEq total) by mouth daily. 30 tablet 3  . metroNIDAZOLE (FLAGYL) 500 MG tablet Take 1 tablet (500 mg total) by mouth 2 (two) times  daily. (Patient not taking: Reported on 06/05/2016) 14 tablet 0   No facility-administered medications prior to visit.     ROS See HPI  BMP Latest Ref Rng & Units 06/05/2016 05/08/2016 09/06/2014  Glucose 70 - 99 mg/dL 93 87 104(H)  BUN 6 - 23 mg/dL 11 11 8   Creatinine 0.40 - 1.20 mg/dL 0.78 0.78 0.80  Sodium 135 - 145 mEq/L 141 140 139  Potassium 3.5 - 5.1 mEq/L 3.6 3.4(L) 3.4(L)  Chloride 96 - 112 mEq/L 104 103 105  CO2 19 - 32 mEq/L 33(H) 31 27  Calcium 8.4 - 10.5 mg/dL 8.6 9.1 9.1   Objective:  BP 132/84   Pulse 92   Temp 98 F (36.7 C)   Ht 5\' 9"  (1.753 m)   Wt 251 lb (113.9 kg)   LMP 05/05/2016 (Approximate)   SpO2 97%   BMI 37.07 kg/m   BP Readings from Last 3 Encounters:  06/05/16 132/84  05/08/16 120/84  09/06/14 118/82    Wt Readings from Last 3 Encounters:  06/05/16 251 lb (113.9 kg)  05/08/16 247 lb (112 kg)  09/06/14 241 lb (109.3 kg)    Physical Exam  Constitutional: She is oriented to person, place, and time. No distress.  HENT:  Right Ear: External ear normal.  Left Ear: External ear normal.  Nose: Nose normal.  Mouth/Throat: No oropharyngeal exudate.  Neck: Normal range of motion. Neck supple.  Cardiovascular: Normal rate, regular rhythm  and normal heart sounds.   Pulmonary/Chest: Effort normal.  Musculoskeletal: Normal range of motion. She exhibits no edema.  Neurological: She is alert and oriented to person, place, and time.  Skin: Skin is warm and dry.  Psychiatric: She has a normal mood and affect. Her behavior is normal.  Vitals reviewed.   Lab Results  Component Value Date   WBC 4.1 05/08/2016   HGB 12.4 05/08/2016   HCT 36.4 05/08/2016   PLT 151.0 05/08/2016   GLUCOSE 93 06/05/2016   CHOL 203 (H) 05/08/2016   TRIG 69.0 05/08/2016   HDL 82.70 05/08/2016   LDLCALC 107 (H) 05/08/2016   ALT 13 05/08/2016   AST 15 05/08/2016   NA 141 06/05/2016   K 3.6 06/05/2016   CL 104 06/05/2016   CREATININE 0.78 06/05/2016   BUN 11  06/05/2016   CO2 33 (H) 06/05/2016   TSH 1.51 05/08/2016   HGBA1C 5.2 05/08/2016    US Transvaginal Non-ob  Result Date: 09/06/2014 CLINICAL DATA:  Pelvic pain for 3 days, primarily on the right EXAM: TRANSABDOMINAL AND TRANSVAGINAL ULTRASOUND OF PELVIS DOPPLER ULTRASOUND OF OVARIES TECHNIQUE: Study was performed transabdominally to optimize pelvic field of view evaluation and transvaginally to optimize internal visceral architecture evaluation. Color and duplex Doppler ultrasound was utilized to evaluate blood flow to the ovaries. COMPARISON:  CT abdomen and pelvis September 06, 2014 FINDINGS: Uterus Measurements: 11.4 x 8.1 x 6.1 cm. There is a mass arising from the right side of the uterine fundus measuring 3.2 x 2.7 x 2.4 cm. There is a mass measuring 3.8 x 3.7 x 4.1 cm arising from the leftward mid fundal region. These masses are felt to represent uterine leiomyomas. The overall echotexture of the uterus is inhomogeneous suggesting diffuse leiomyomatous change. Endometrium Thickness: 7 mm.  No focal abnormality visualized. Right ovary Measurements: 3.3 x 3.0 x 2.3 cm. Normal appearance/no adnexal mass beyond a dominant follicle measuring 1.4 x 1.3 cm. Left ovary Measurements: 4.1 x 2.0 x 2.7 cm. Normal appearance/no adnexal mass. Pulsed Doppler evaluation of both ovaries demonstrates normal low-resistance arterial and venous waveforms. Peak systolic velocity for each ovary is 10 cm/sec. Other findings Small amount of free fluid. IMPRESSION: Enlarged leiomyomatous uterus. No ovarian mass beyond dominant follicle right ovary. No ovarian torsion on either side. Small amount of free pelvic fluid could be indicative of recent ovarian cyst rupture. Electronically Signed   By: Lowella Grip III M.D.   On: 09/06/2014 10:31   US Pelvis Complete  Result Date: 09/06/2014 CLINICAL DATA:  Pelvic pain for 3 days, primarily on the right EXAM: TRANSABDOMINAL AND TRANSVAGINAL ULTRASOUND OF PELVIS DOPPLER ULTRASOUND OF  OVARIES TECHNIQUE: Study was performed transabdominally to optimize pelvic field of view evaluation and transvaginally to optimize internal visceral architecture evaluation. Color and duplex Doppler ultrasound was utilized to evaluate blood flow to the ovaries. COMPARISON:  CT abdomen and pelvis September 06, 2014 FINDINGS: Uterus Measurements: 11.4 x 8.1 x 6.1 cm. There is a mass arising from the right side of the uterine fundus measuring 3.2 x 2.7 x 2.4 cm. There is a mass measuring 3.8 x 3.7 x 4.1 cm arising from the leftward mid fundal region. These masses are felt to represent uterine leiomyomas. The overall echotexture of the uterus is inhomogeneous suggesting diffuse leiomyomatous change. Endometrium Thickness: 7 mm.  No focal abnormality visualized. Right ovary Measurements: 3.3 x 3.0 x 2.3 cm. Normal appearance/no adnexal mass beyond a dominant follicle measuring 1.4 x 1.3 cm. Left ovary  Measurements: 4.1 x 2.0 x 2.7 cm. Normal appearance/no adnexal mass. Pulsed Doppler evaluation of both ovaries demonstrates normal low-resistance arterial and venous waveforms. Peak systolic velocity for each ovary is 10 cm/sec. Other findings Small amount of free fluid. IMPRESSION: Enlarged leiomyomatous uterus. No ovarian mass beyond dominant follicle right ovary. No ovarian torsion on either side. Small amount of free pelvic fluid could be indicative of recent ovarian cyst rupture. Electronically Signed   By: Lowella Grip III M.D.   On: 09/06/2014 10:31   Ct Abdomen Pelvis W Contrast  Result Date: 09/06/2014 CLINICAL DATA:  Three-day history of right lower quadrant pain. EXAM: CT ABDOMEN AND PELVIS WITH CONTRAST TECHNIQUE: Multidetector CT imaging of the abdomen and pelvis was performed using the standard protocol following bolus administration of intravenous contrast. Oral contrast was also administered. CONTRAST:  23mL OMNIPAQUE IOHEXOL 300 MG/ML SOLN, 152mL OMNIPAQUE IOHEXOL 300 MG/ML SOLN COMPARISON:  None.  FINDINGS: Lung bases are clear. No focal liver lesions are identified. The gallbladder wall is not appreciably thickened. There is no biliary duct dilatation. Spleen, pancreas, and adrenals appear normal. Kidneys bilaterally show no mass or hydronephrosis on either side. There is no renal or ureteral calculus on either side. In the pelvis, urinary bladder is midline with normal wall thickness. The uterus is irregular in contour. There are multiple masses within the uterus. The largest mass is in the anterior inferior fundal region measuring 3.7 x 3.7 x 3.3 cm. These changes are felt to be due to leiomyomas within the uterus. There is no pelvic mass outside of the uterus. There is a small amount of free fluid in the cul-de-sac region. The appendix region appears normal. There is no appreciable bowel obstruction. No free air or portal venous air. There is no adenopathy or abscess in the abdomen or pelvis. There is a prominent hem azygous vein on the left to the level of the left renal vein, an anatomic variant. There is no abdominal aortic aneurysm. There are no blastic or lytic bone lesions. A small bone island in the L1 vertebral body is noted incidentally. IMPRESSION: 1 enlarged leiomyomatous uterus. Small amount of free fluid in cul-de-sac. Suspect recent ovarian cyst rupture. Appendiceal region appears unremarkable. No bowel obstruction. No abscess. No renal or ureteral calculus. No hydronephrosis. Electronically Signed   By: Lowella Grip III M.D.   On: 09/06/2014 08:39   [place] Art/ven Flow Abd Pelv Doppler  Result Date: 09/06/2014 CLINICAL DATA:  Pelvic pain for 3 days, primarily on the right EXAM: TRANSABDOMINAL AND TRANSVAGINAL ULTRASOUND OF PELVIS DOPPLER ULTRASOUND OF OVARIES TECHNIQUE: Study was performed transabdominally to optimize pelvic field of view evaluation and transvaginally to optimize internal visceral architecture evaluation. Color and duplex Doppler ultrasound was utilized to evaluate  blood flow to the ovaries. COMPARISON:  CT abdomen and pelvis September 06, 2014 FINDINGS: Uterus Measurements: 11.4 x 8.1 x 6.1 cm. There is a mass arising from the right side of the uterine fundus measuring 3.2 x 2.7 x 2.4 cm. There is a mass measuring 3.8 x 3.7 x 4.1 cm arising from the leftward mid fundal region. These masses are felt to represent uterine leiomyomas. The overall echotexture of the uterus is inhomogeneous suggesting diffuse leiomyomatous change. Endometrium Thickness: 7 mm.  No focal abnormality visualized. Right ovary Measurements: 3.3 x 3.0 x 2.3 cm. Normal appearance/no adnexal mass beyond a dominant follicle measuring 1.4 x 1.3 cm. Left ovary Measurements: 4.1 x 2.0 x 2.7 cm. Normal appearance/no adnexal mass. Pulsed Doppler evaluation  of both ovaries demonstrates normal low-resistance arterial and venous waveforms. Peak systolic velocity for each ovary is 10 cm/sec. Other findings Small amount of free fluid. IMPRESSION: Enlarged leiomyomatous uterus. No ovarian mass beyond dominant follicle right ovary. No ovarian torsion on either side. Small amount of free pelvic fluid could be indicative of recent ovarian cyst rupture. Electronically Signed   By: Lowella Grip III M.D.   On: 09/06/2014 10:31    Assessment & Plan:   Lisa Boyer was seen today for follow-up.  Diagnoses and all orders for this visit:  HTN (hypertension), benign -     Basic Metabolic Panel (BMET); Future  Hypokalemia -     Basic Metabolic Panel (BMET); Future  Adjustment disorder with mixed anxiety and depressed mood   I have discontinued Ms. Thone's metroNIDAZOLE and potassium chloride. I am also having her maintain her Vitamin D, triamterene-hydrochlorothiazide, multivitamin with minerals, ferrous sulfate, ketotifen, fluticasone, ketoconazole, tretinoin, and naproxen.  No orders of the defined types were placed in this encounter.   Follow-up: Return in about 3 months (around 09/03/2016) for HTN.  Wilfred Lacy, NP

## 2016-06-05 NOTE — Patient Instructions (Addendum)
Avoid tight braids, hot combs and relaxers.  During next Ov, inquire about Family history of any autoimmune disease.

## 2016-06-05 NOTE — Assessment & Plan Note (Addendum)
Managed by dermatologist with [facility].

## 2016-06-05 NOTE — Progress Notes (Signed)
Pre visit review using our clinic review tool, if applicable. No additional management support is needed unless otherwise documented below in the visit note. 

## 2016-06-05 NOTE — Assessment & Plan Note (Signed)
Last mamm[facility] with CAD 02/2016 done by [facility]: bilateral scattered fibroglandular densities, benign left breast mass. Recommended repeat in 1year.

## 2016-06-05 NOTE — Assessment & Plan Note (Signed)
Managed by psychologist with [facility]. Attending support group called "mindfulness" at this time. Improved mental status and life's outlook compared to 27month ago.

## 2016-07-10 ENCOUNTER — Encounter: Payer: Self-pay | Admitting: Gastroenterology

## 2016-07-10 ENCOUNTER — Ambulatory Visit (INDEPENDENT_AMBULATORY_CARE_PROVIDER_SITE_OTHER): Payer: Medicare HMO | Admitting: Gastroenterology

## 2016-07-10 ENCOUNTER — Encounter (INDEPENDENT_AMBULATORY_CARE_PROVIDER_SITE_OTHER): Payer: Self-pay

## 2016-07-10 VITALS — BP 134/92 | HR 88 | Ht 68.25 in | Wt 248.4 lb

## 2016-07-10 DIAGNOSIS — R140 Abdominal distension (gaseous): Secondary | ICD-10-CM

## 2016-07-10 DIAGNOSIS — R194 Change in bowel habit: Secondary | ICD-10-CM | POA: Diagnosis not present

## 2016-07-10 DIAGNOSIS — R198 Other specified symptoms and signs involving the digestive system and abdomen: Secondary | ICD-10-CM

## 2016-07-10 MED ORDER — NA SULFATE-K SULFATE-MG SULF 17.5-3.13-1.6 GM/177ML PO SOLN: 1.0000 | Freq: Once | ORAL | 0 refills | Status: AC

## 2016-07-10 NOTE — Patient Instructions (Signed)
If you are age 47 or older, your body mass index should be between 23-30. Your Body mass index is 37.49 kg/m. If this is out of the aforementioned range listed, please consider follow up with your Primary Care Provider.  If you are age 62 or younger, your body mass index should be between 19-25. Your Body mass index is 37.49 kg/m. If this is out of the aformentioned range listed, please consider follow up with your Primary Care Provider.   You have been scheduled for a colonoscopy. Please follow written instructions given to you at your visit today.  Please pick up your prep supplies at the pharmacy within the next 1-3 days. If you use inhalers (even only as needed), please bring them with you on the day of your procedure. Your physician has requested that you go to www.startemmi.com and enter the access code given to you at your visit today. This web site gives a general overview about your procedure. However, you should still follow specific instructions given to you by our office regarding your preparation for the procedure.   Thank you for choosing me and [facility].  Dr. Madelon Lips

## 2016-07-10 NOTE — Progress Notes (Signed)
[facility] Consult Note:  History: Lisa Boyer 07/10/2016  Referring physician: Wilfred Lacy, NP  Reason for consult/chief complaint: Gas (belching after eating); change in bowel habits (constipation and then at times have to go right then); and Bloated   Subjective  HPI:  This is a 46 year old woman referred by primary care for change in bowel habits and abdominal bloating with gas. She reports that for about a year she has had some intermittent lower abdominal bloating and sometimes visible distention after meals. Her bowel habits tend alternate: She denies rectal bleeding or weight loss. She was concerned because she recently learned some medical history from her biologic mother, which included breast and skin cancer. There is no known family history of colon cancer. She was cared for at the [facility] for years and felt that various symptoms that she might have more often attributed to PTSD. She is just concerned about her ongoing symptoms and wants to make sure is no harmful cause.  ROS:  Review of Systems  Constitutional: Negative for appetite change and unexpected weight change.  HENT: Negative for mouth sores and voice change.   Eyes: Negative for pain and redness.  Respiratory: Negative for cough and shortness of breath.   Cardiovascular: Negative for chest pain and palpitations.  Genitourinary: Negative for dysuria and hematuria.  Musculoskeletal: Negative for arthralgias and myalgias.  Skin: Negative for pallor and rash.  Neurological: Negative for weakness and headaches.  Hematological: Negative for adenopathy.     Past Medical History: Past Medical History:  Diagnosis Date  . Allergy   . Anemia   . Anxiety   . Back pain 04/1992   chronic back pain-diagnose from [place]  . Depression   . Headache    frequent headache  . Hypertension   . PTSD (post-traumatic stress disorder)   . Thyroid disease   . Uterine fibroid      Past Surgical History: Past  Surgical History:  Procedure Laterality Date  . BREAST BIOPSY Bilateral 1888/1191/1994/2001   remove growth from both side breast  . ENDOMETRIAL ABLATION    . THYROIDECTOMY  08/1999   partial     Family History: Family History  Problem Relation Age of Onset  . Adopted: Yes  . Breast cancer Mother     and 2 other types but don't know what kind  . Skin cancer Mother     Social History: Social History   Social History  . Marital status: Widowed    Spouse name: N/A  . Number of children: 1  . Years of education: N/A   Occupational History  . Education officer, museum    Social History Main Topics  . Smoking status: Never Smoker  . Smokeless tobacco: Never Used  . Alcohol use Yes     Comment: social-twice a year  . Drug use: No  . Sexual activity: Not Asked   Other Topics Concern  . None   Social History Narrative  . None    Allergies: Allergies  Allergen Reactions  . Citrus     Tongue burns and goes numb  . Latex Other (See Comments)    Discolor-stay for while    Outpatient Meds: Current Outpatient Prescriptions  Medication Sig Dispense Refill  . Cholecalciferol (VITAMIN D) 2000 UNITS tablet Take 2,000 Units by mouth 2 (two) times daily.     . ferrous sulfate 325 (65 FE) MG tablet Take 325 mg by mouth daily as needed (during menstrual cycle).     . fluticasone (FLONASE) 50  MCG/ACT nasal spray Place 2 sprays into the nose daily as needed for rhinitis.    Marland Kitchen ketoconazole (NIZORAL) 2 % shampoo Apply 1 application topically every 14 (fourteen) days.    Marland Kitchen ketotifen (ZADITOR) 0.025 % ophthalmic solution Place 1 drop into both eyes daily as needed (allergies).    . Multiple Vitamin (MULTIVITAMIN WITH MINERALS) TABS tablet Take 1 tablet by mouth daily.    . naproxen (NAPROSYN) 250 MG tablet Take 1 tablet (250 mg total) by mouth 2 (two) times daily as needed for mild pain or moderate pain (take with food). 14 tablet 0  . potassium chloride (K-DUR) 10 MEQ tablet Take 1 tablet by  mouth daily.    Marland Kitchen tretinoin (RETIN-A) 0.05 % cream Apply 1 application topically once a week.     . triamterene-hydrochlorothiazide (MAXZIDE) 75-50 MG per tablet Take 0.5 tablets by mouth daily.    . Na Sulfate-K Sulfate-Mg Sulf 17.5-3.13-1.6 GM/180ML SOLN Take 1 kit by mouth once. 354 mL 0   No current facility-administered medications for this visit.       ___________________________________________________________________ Objective   Exam:  BP (!) 134/92 (BP Location: Left Arm, Patient Position: Sitting, Cuff Size: Large)   Pulse 88   Ht 5' 8.25" (1.734 m) Comment: height measured without shoes  Wt 248 lb 6 oz (112.7 kg)   LMP 06/22/2016   BMI 37.49 kg/m    General: this is a(n) Well-appearing woman who looks younger than stated age   Eyes: sclera anicteric, no redness  ENT: oral mucosa moist without lesions, no cervical or supraclavicular lymphadenopathy, good dentition  CV: RRR without murmur, S1/S2, no JVD, no peripheral edema  Resp: clear to auscultation bilaterally, normal RR and effort noted  GI: soft, no tenderness, with active bowel sounds. No guarding or palpable organomegaly noted.  Skin; warm and dry, no rash or jaundice noted  Neuro: awake, alert and oriented x 3. Normal gross motor function and fluent speech  Labs:  CBC Latest Ref Rng & Units 05/08/2016 09/06/2014 07/06/2010  WBC 4.0 - 10.5 K/uL 4.1 6.9 -  Hemoglobin 12.0 - 15.0 g/dL 12.4 12.7 13.3  Hematocrit 36.0 - 46.0 % 36.4 36.3 39.0  Platelets 150.0 - 400.0 K/uL 151.0 PLATELET CLUMPS NOTED ON SMEAR, COUNT APPEARS ADEQUATE -     CT abdomen and pelvis April 2016 shows uterine fibroids, no other pathology  Assessment: Encounter Diagnoses  Name Primary?  . Change in bowel function Yes  . Abdominal bloating     The symptoms sound likely benign such as IBS. However, given their duration and her concerns, I have opted to pursue a colonoscopy to rule out any other pathology. She is agreeable.  The  benefits and risks of the planned procedure were described in detail with the patient or (when appropriate) their health care proxy.  Risks were outlined as including, but not limited to, bleeding, infection, perforation, adverse medication reaction leading to cardiac or pulmonary decompensation, or pancreatitis (if ERCP).  The limitation of incomplete mucosal visualization was also discussed.  No guarantees or warranties were given.   Thank you for the courtesy of this consult.  Please call me with any questions or concerns.  Nelida Meuse III  CC: Wilfred Lacy, NP

## 2016-07-31 ENCOUNTER — Encounter: Payer: Self-pay | Admitting: Gastroenterology

## 2016-08-14 ENCOUNTER — Encounter: Payer: Medicare HMO | Admitting: Gastroenterology

## 2016-09-03 ENCOUNTER — Ambulatory Visit: Payer: Medicare HMO | Admitting: Nurse Practitioner

## 2016-09-09 ENCOUNTER — Ambulatory Visit: Payer: Medicare HMO | Admitting: Psychiatry

## 2016-09-10 IMAGING — CT CT ABD-PELV W/ CM
2 of 5 series · 16 of 46 positions shown, 18 images · IV contrast (Omnipaque 300)
Comparison: None.

CLINICAL DATA: Three-day history of right lower quadrant pain.

EXAM:
CT ABDOMEN AND PELVIS WITH CONTRAST
TECHNIQUE: Multidetector CT imaging of the abdomen and pelvis was performed
using the standard protocol following bolus administration of
intravenous contrast. Oral contrast was also administered.
CONTRAST:  25mL OMNIPAQUE IOHEXOL 300 MG/ML SOLN, 100mL OMNIPAQUE
IOHEXOL 300 MG/ML SOLN

[Series 2: abd_pel_with 5.0 b40f · axial · 0.78mm/px · z∈[-431,-6]mm · 13 of 96 slices shown, 15 images]
[im 6/96  soft-tissue]
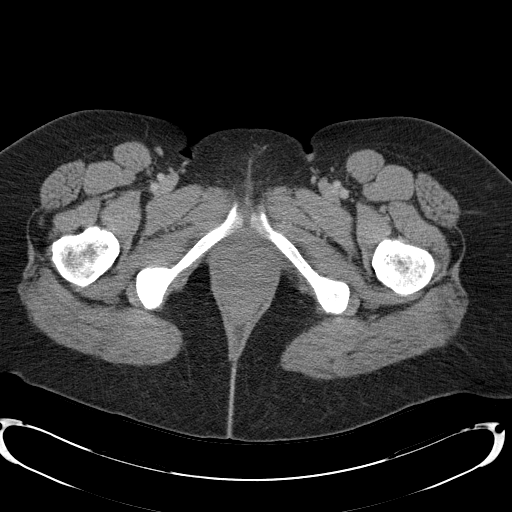
[im 6/96  bone]
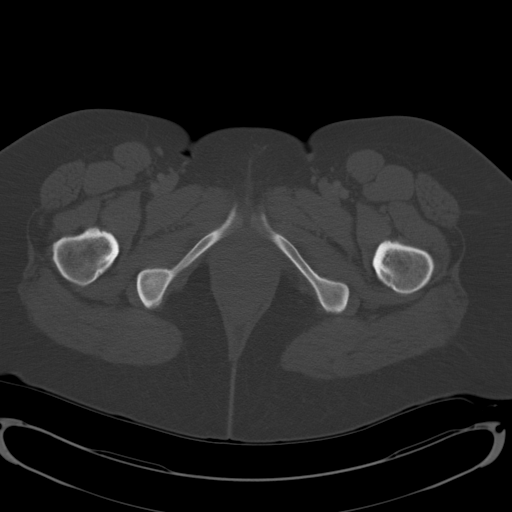
[im 16/96  soft-tissue]
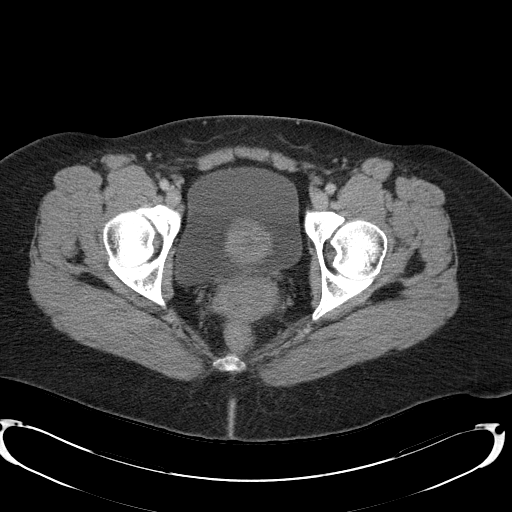
[im 21/96  soft-tissue]
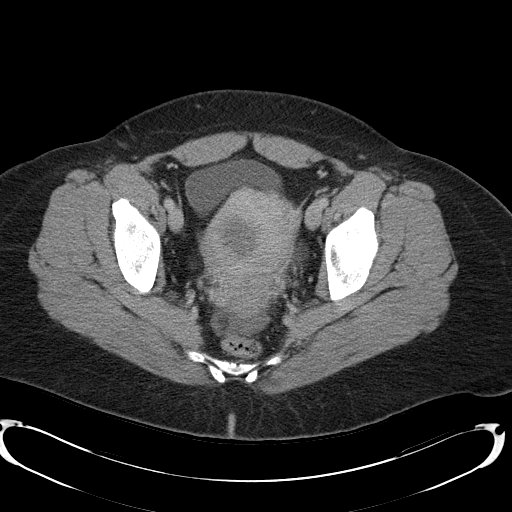
[im 26/96  soft-tissue]
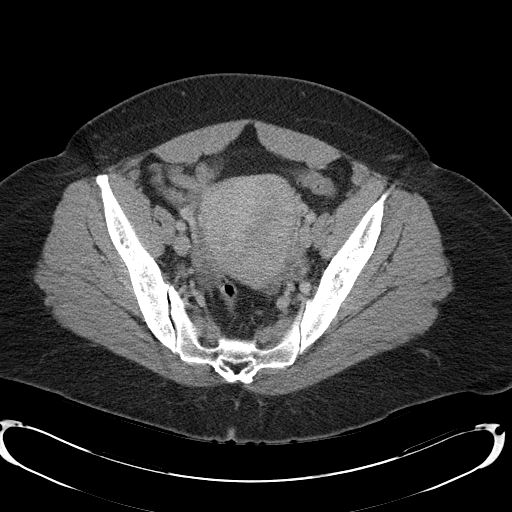
[im 36/96  soft-tissue]
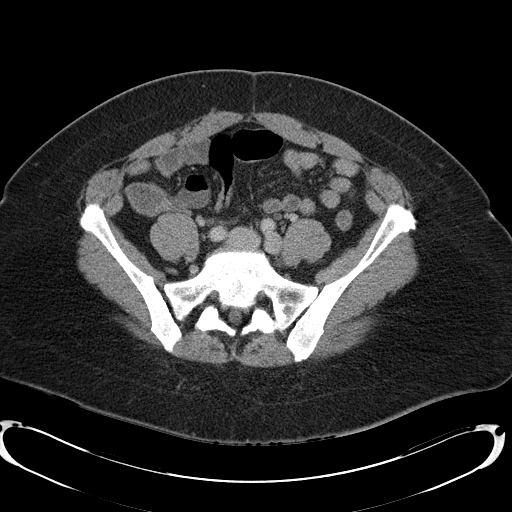
[im 41/96  soft-tissue]
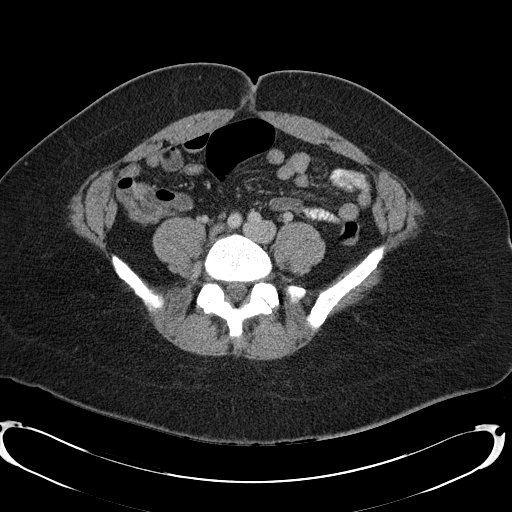
[im 51/96  soft-tissue]
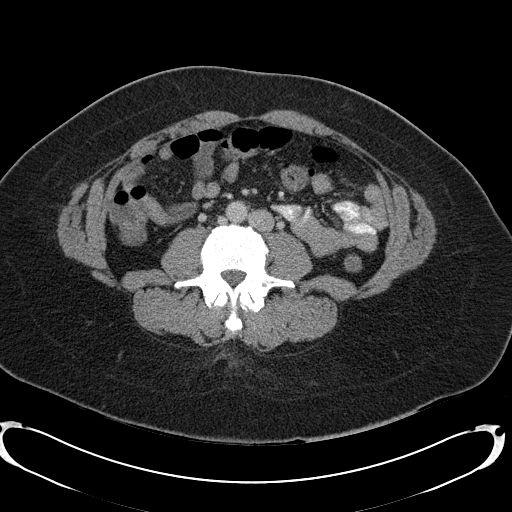
[im 56/96  soft-tissue]
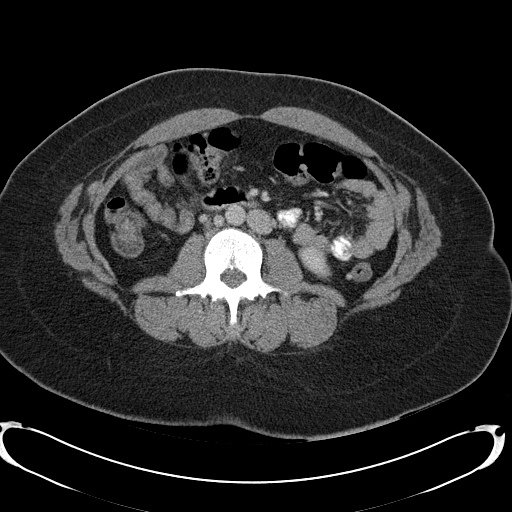
[im 61/96  soft-tissue]
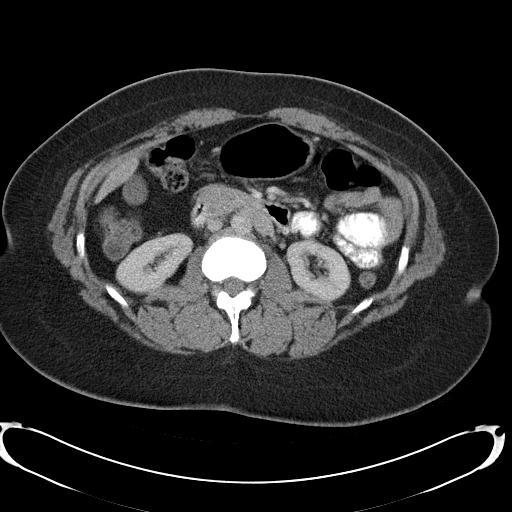
[im 61/96  bone]
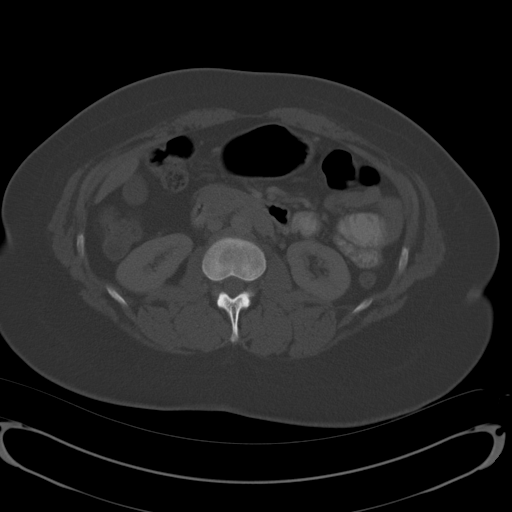
[im 71/96  soft-tissue]
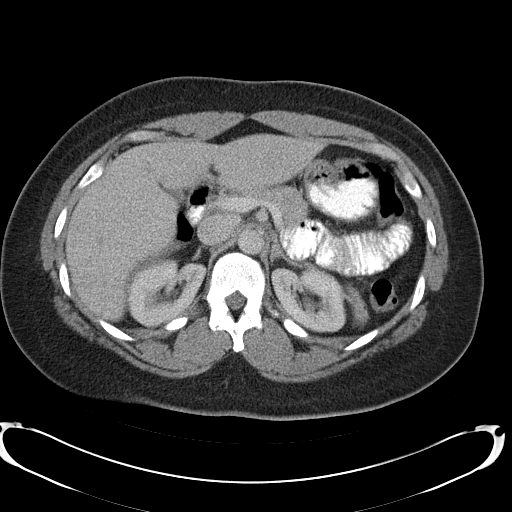
[im 76/96  soft-tissue]
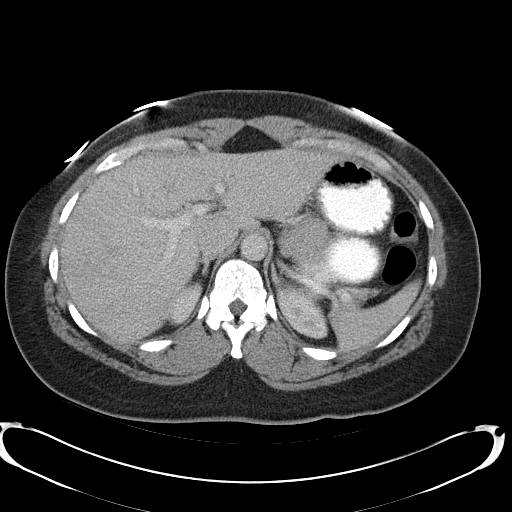
[im 81/96  soft-tissue]
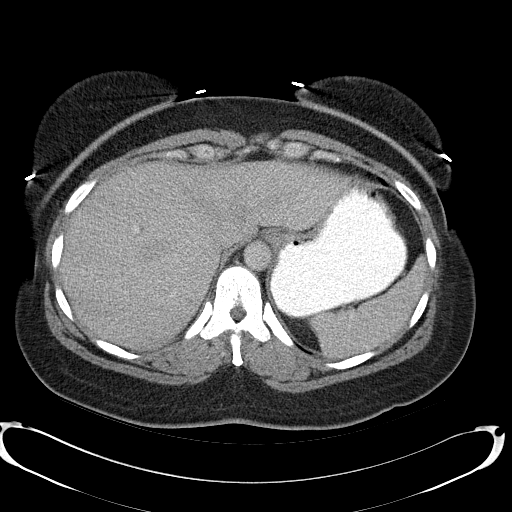
[im 91/96  soft-tissue]
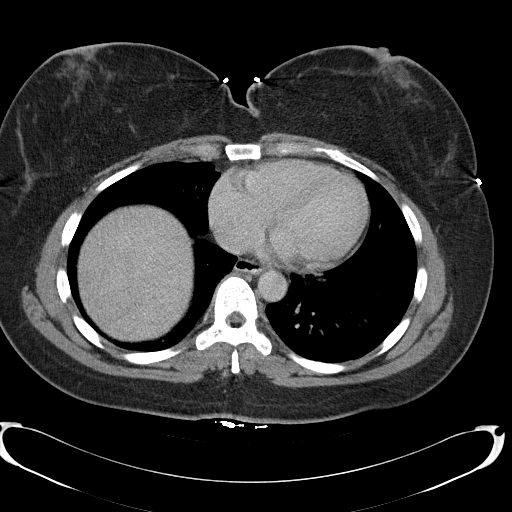

[Series 3: abd_pel_with 3.0 spo cor · coronal · 0.80mm/px · 3 of 76 slices shown]
[im 26/76  soft-tissue]
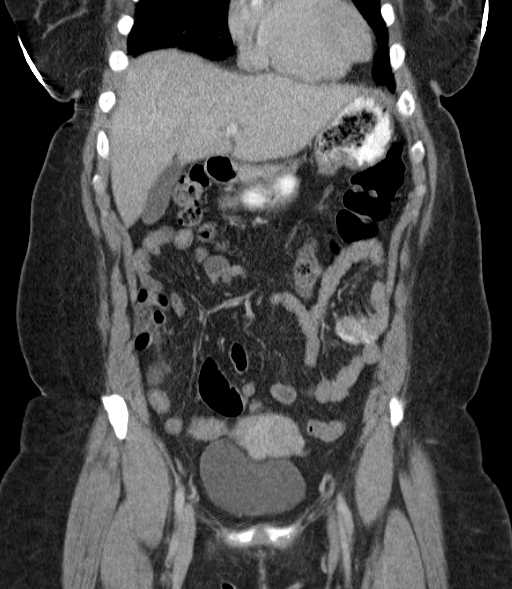
[im 34/76  soft-tissue]
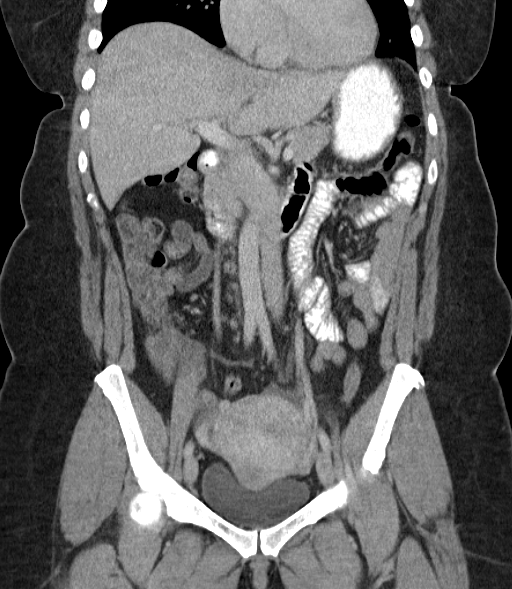
[im 42/76  soft-tissue]
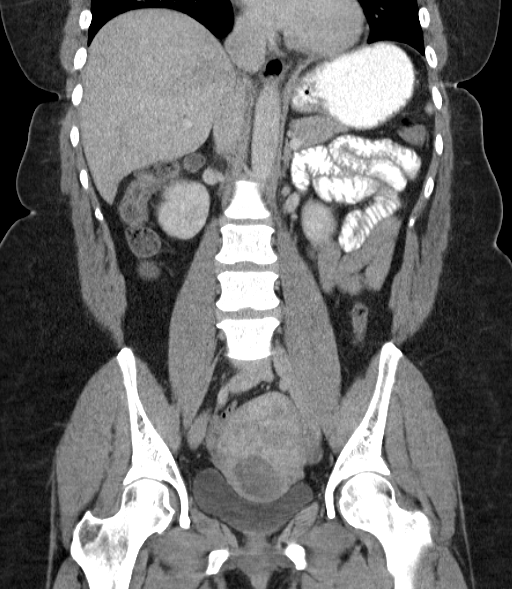

[16 of 46 positions shown; findings below may reference images not displayed]

FINDINGS: Lung bases are clear.

No focal liver lesions are identified. The gallbladder wall is not
appreciably thickened. There is no biliary duct dilatation.

Spleen, pancreas, and adrenals appear normal. Kidneys bilaterally
show no mass or hydronephrosis on either side. There is no renal or
ureteral calculus on either side.

In the pelvis, urinary bladder is midline with normal wall
thickness. The uterus is irregular in contour. There are multiple
masses within the uterus. The largest mass is in the anterior
inferior fundal region measuring 3.7 x 3.7 x 3.3 cm. These changes
are felt to be due to leiomyomas within the uterus. There is no
pelvic mass outside of the uterus. There is a small amount of free
fluid in the cul-de-sac region. The appendix region appears normal.

There is no appreciable bowel obstruction. No free air or portal
venous air. There is no adenopathy or abscess in the abdomen or
pelvis. There is a prominent Bettini azygous vein on the left to the
level of the left renal vein, an anatomic variant. There is no
abdominal aortic aneurysm. There are no blastic or lytic bone
lesions. A small bone island in the L1 vertebral body is noted
incidentally.
IMPRESSION: 1 enlarged leiomyomatous uterus. Small amount of free fluid in
cul-de-sac. Suspect recent ovarian cyst rupture.

Appendiceal region appears unremarkable. No bowel obstruction. No
abscess. No renal or ureteral calculus. No hydronephrosis.

## 2017-08-13 HISTORY — PX: ABDOMINAL HYSTERECTOMY: SHX81

## 2017-09-29 ENCOUNTER — Ambulatory Visit: Payer: Self-pay | Admitting: Sports Medicine

## 2017-10-20 ENCOUNTER — Ambulatory Visit (INDEPENDENT_AMBULATORY_CARE_PROVIDER_SITE_OTHER): Payer: Medicare HMO

## 2017-10-20 ENCOUNTER — Other Ambulatory Visit: Payer: Self-pay | Admitting: Sports Medicine

## 2017-10-20 ENCOUNTER — Encounter: Payer: Self-pay | Admitting: Sports Medicine

## 2017-10-20 ENCOUNTER — Ambulatory Visit (INDEPENDENT_AMBULATORY_CARE_PROVIDER_SITE_OTHER): Payer: Medicare HMO | Admitting: Sports Medicine

## 2017-10-20 VITALS — BP 117/76 | HR 86 | Resp 16

## 2017-10-20 DIAGNOSIS — M79671 Pain in right foot: Secondary | ICD-10-CM | POA: Diagnosis not present

## 2017-10-20 DIAGNOSIS — M79672 Pain in left foot: Secondary | ICD-10-CM | POA: Diagnosis not present

## 2017-10-20 DIAGNOSIS — M722 Plantar fascial fibromatosis: Secondary | ICD-10-CM

## 2017-10-20 DIAGNOSIS — M2141 Flat foot [pes planus] (acquired), right foot: Secondary | ICD-10-CM | POA: Diagnosis not present

## 2017-10-20 DIAGNOSIS — M729 Fibroblastic disorder, unspecified: Secondary | ICD-10-CM | POA: Diagnosis not present

## 2017-10-20 DIAGNOSIS — M2142 Flat foot [pes planus] (acquired), left foot: Secondary | ICD-10-CM

## 2017-10-20 NOTE — Patient Instructions (Signed)

## 2017-10-20 NOTE — Progress Notes (Addendum)
Subjective: Lisa Boyer is a 47 y.o. female patient presents to office with complaint of moderatearch pain on the left and right. Patient admits to post static dyskinesia for years since time in service. Patient has treated this problem with custom insoles with a little relief, due for a set from [place] this week. Pain is worse in feet with extensive walking or standing. Sharp pins and throbbing pain and only comfortable shoes are tennis shoes. 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 Admits that she suffers with memory issues and that sometimes she get anxious about her medical conditions. Reports that she is on a fluid medication that has helped the swelling in her ankles. Denies any other pedal complaints.   Review of Systems  Musculoskeletal: Positive for joint pain.  All other systems reviewed and are negative.    Patient Active Problem List   Diagnosis Date Noted  . Alopecia 06/05/2016  . Dense breast tissue on mammogram 06/05/2016  . Encounter for preventative adult health care examination 05/08/2016  . HTN (hypertension), benign 05/08/2016  . Adjustment disorder with mixed anxiety and depressed mood 05/08/2016    Current Outpatient Medications on File Prior to Visit  Medication Sig Dispense Refill  . Cholecalciferol (VITAMIN D) 2000 UNITS tablet Take 2,000 Units by mouth 2 (two) times daily.     . ferrous sulfate 325 (65 FE) MG tablet Take 325 mg by mouth daily as needed (during menstrual cycle).     . fluticasone (FLONASE) 50 MCG/ACT nasal spray Place 2 sprays into the nose daily as needed for rhinitis.    Marland Kitchen ketoconazole (NIZORAL) 2 % shampoo Apply 1 application topically every 14 (fourteen) days.    Marland Kitchen ketotifen (ZADITOR) 0.025 % ophthalmic solution Place 1 drop into both eyes daily as needed (allergies).    . Multiple Vitamin (MULTIVITAMIN WITH MINERALS) TABS  tablet Take 1 tablet by mouth daily.    . naproxen (NAPROSYN) 250 MG tablet Take 1 tablet (250 mg total) by mouth 2 (two) times daily as needed for mild pain or moderate pain (take with food). 14 tablet 0  . potassium chloride (K-DUR) 10 MEQ tablet Take 1 tablet by mouth daily.    Marland Kitchen tretinoin (RETIN-A) 0.05 % cream Apply 1 application topically once a week.     . triamterene-hydrochlorothiazide (MAXZIDE) 75-50 MG per tablet Take 0.5 tablets by mouth daily.     No current facility-administered medications on file prior to visit.     Allergies  Allergen Reactions  . Citrus     Tongue burns and goes numb  . Latex Other (See Comments)    Discolor-stay for while Adhesive tape - leaves a mark on skin    Objective: Physical Exam General: The patient is alert and oriented x3 in no acute distress.  Dermatology: Skin is warm, dry and supple bilateral lower extremities. Nails 1-10 are polished. There is no erythema, edema, no eccymosis, no open lesions present. Integument is otherwise unremarkable.  Vascular: Dorsalis Pedis pulse and Posterior Tibial pulse are 2/4 bilateral. Capillary fill time is immediate to all digits.  Neurological: Grossly intact to light touch with an achilles reflex of +2/5 and a  negative Tinel's sign bilateral.  Musculoskeletal: Subjective pain to palpation at arches and distally along plantar fascia bilateral. No pain with compression of calcaneus bilateral. No pain with tuning fork to calcaneus  bilateral. No pain with calf compression bilateral. There is decreased Ankle joint range of motion bilateral. All other joints range of motion within normal limits bilateral. Strength 5/5 in all groups bilateral.   Xray, Right and left foot:  Normal osseous mineralization. Joint spaces preserved except at midfoot where there is breach supportive of pes planus. No fracture/dislocation/boney destruction. Calcaneal spur present with mild thickening of plantar fascia. No other soft  tissue abnormalities or radiopaque foreign bodies.   Assessment and Plan: Problem List Items Addressed This Visit    None    Visit Diagnoses    Foot pain, bilateral    -  Primary   Relevant Orders   DG Foot Complete Left (Completed)   Fasciitis       Pes planus of both feet          -Complete examination performed.  -Xrays reviewed -Discussed with patient in detail the condition of plantar fasciitis with pes planus, how this occurs and general treatment options. Explained both conservative and surgical treatments.  -Recommend OTC topical pain cream .  -Advised patient to get orthotics from [facility] -Recommended good supportive shoes  -Explained and dispensed to patient daily stretching exercises. -Recommend patient to ice affected area 1-2x daily. -Patient to return to office in 4 weeks for follow up or sooner if problems or questions arise. IF NO IMPROVEMENT WILL CONSIDER INJECTION OR MEDICATION THERAPY.   Landis Martins, DPM

## 2017-11-17 ENCOUNTER — Ambulatory Visit: Payer: Non-veteran care | Admitting: Sports Medicine

## 2017-11-17 ENCOUNTER — Encounter: Payer: Self-pay | Admitting: Sports Medicine

## 2017-11-17 ENCOUNTER — Ambulatory Visit (INDEPENDENT_AMBULATORY_CARE_PROVIDER_SITE_OTHER): Payer: Medicare HMO | Admitting: Sports Medicine

## 2017-11-17 DIAGNOSIS — B351 Tinea unguium: Secondary | ICD-10-CM

## 2017-11-17 DIAGNOSIS — M2141 Flat foot [pes planus] (acquired), right foot: Secondary | ICD-10-CM

## 2017-11-17 DIAGNOSIS — M2142 Flat foot [pes planus] (acquired), left foot: Secondary | ICD-10-CM

## 2017-11-17 DIAGNOSIS — M729 Fibroblastic disorder, unspecified: Secondary | ICD-10-CM

## 2017-11-17 DIAGNOSIS — M79672 Pain in left foot: Secondary | ICD-10-CM

## 2017-11-17 DIAGNOSIS — M79671 Pain in right foot: Secondary | ICD-10-CM

## 2017-11-17 NOTE — Progress Notes (Signed)
Subjective: Lisa Boyer is a 47 y.o. female patient returns to office for follow up evaluation of bilateral foot pain; reports that her topical icy hot helps, and got her orthotics from [facility] an hour ago. Patient states her feet still hurt but she feels like she is making some progress. Admits that she is concerned with her nails especially bilateral 1st, 4-5 toes. Denies any other pedal complaints.    Patient Active Problem List   Diagnosis Date Noted  . Alopecia 06/05/2016  . Dense breast tissue on mammogram 06/05/2016  . Encounter for preventative adult health care examination 05/08/2016  . HTN (hypertension), benign 05/08/2016  . Adjustment disorder with mixed anxiety and depressed mood 05/08/2016    Current Outpatient Medications on File Prior to Visit  Medication Sig Dispense Refill  . Cholecalciferol (VITAMIN D) 2000 UNITS tablet Take 2,000 Units by mouth 2 (two) times daily.     . ferrous sulfate 325 (65 FE) MG tablet Take 325 mg by mouth daily as needed (during menstrual cycle).     . fluticasone (FLONASE) 50 MCG/ACT nasal spray Place 2 sprays into the nose daily as needed for rhinitis.    Marland Kitchen ketoconazole (NIZORAL) 2 % shampoo Apply 1 application topically every 14 (fourteen) days.    Marland Kitchen ketotifen (ZADITOR) 0.025 % ophthalmic solution Place 1 drop into both eyes daily as needed (allergies).    . Multiple Vitamin (MULTIVITAMIN WITH MINERALS) TABS tablet Take 1 tablet by mouth daily.    . naproxen (NAPROSYN) 250 MG tablet Take 1 tablet (250 mg total) by mouth 2 (two) times daily as needed for mild pain or moderate pain (take with food). 14 tablet 0  . potassium chloride (K-DUR) 10 MEQ tablet Take 1 tablet by mouth daily.    Marland Kitchen tretinoin (RETIN-A) 0.05 % cream Apply 1 application topically once a week.     . triamterene-hydrochlorothiazide (MAXZIDE) 75-50 MG per tablet Take 0.5 tablets by mouth daily.     No current facility-administered medications on file prior to visit.      Allergies  Allergen Reactions  . Citrus     Tongue burns and goes numb  . Latex Other (See Comments)    Discolor-stay for while Adhesive tape - leaves a mark on skin    Objective: Physical Exam General: The patient is alert and oriented x3 in no acute distress.  Dermatology: Skin is warm, dry and supple bilateral lower extremities. Nails 1-10 are thickened with subungal debris and possible fungus. There is no erythema, edema, no eccymosis, no open lesions present. Integument is otherwise unremarkable.  Vascular: Dorsalis Pedis pulse and Posterior Tibial pulse are 2/4 bilateral. Capillary fill time is immediate to all digits. Trace edema to ankles.   Neurological: Grossly intact to light touch with an achilles reflex of +2/5 and a  negative Tinel's sign bilateral.  Musculoskeletal: Subjective pain to palpation at arches and along plantar fascia bilateral that is a little better. No pain with compression of calcaneus bilateral. No pain with tuning fork to calcaneus bilateral. No pain with calf compression bilateral. There is decreased Ankle joint range of motion bilateral. All other joints range of motion within normal limits bilateral. Strength 5/5 in all groups bilateral.   Assessment and Plan: Problem List Items Addressed This Visit    None    Visit Diagnoses    Nail fungus    -  Primary   Relevant Orders   Culture, fungus without smear   Fasciitis  Pes planus of both feet       Foot pain, bilateral          -Complete examination performed.  -Re-Discussed with patient in detail the condition of plantar fasciitis with pes planus, how this occurs and general treatment options. Explained both conservative and surgical treatments.  -Continue with OTC topical pain cream .  -Continue with orthotics from [facility] continue with good supportive shoes  -Continue with daily stretching exercises. -Recommend patient to continue to ice affected area 1-2x  daily. -Fungal culture was obtained from hard nail plate itself from all toes and sent to [facility] -Patient to return to office in 4 weeks for fungal culture results Landis Martins, DPM

## 2017-12-22 ENCOUNTER — Telehealth: Payer: Self-pay | Admitting: Sports Medicine

## 2017-12-22 ENCOUNTER — Telehealth: Payer: Self-pay | Admitting: *Deleted

## 2017-12-22 ENCOUNTER — Ambulatory Visit: Payer: Non-veteran care | Admitting: Sports Medicine

## 2017-12-22 NOTE — Telephone Encounter (Signed)
Lisa Boyer Let her know that her fungal culture is negative, The thickness and discoloration to her nails is likely coming from pressure from shoes or the way she walks or stands which can be helped by orthotics of which she just recently got from [facility]. -Dr. Chauncey Cruel

## 2017-12-22 NOTE — Telephone Encounter (Signed)
Pt was wondering if she can get her test results over the phones, shes having an anxiety atttack and unable to keep her appt.

## 2017-12-23 NOTE — Telephone Encounter (Signed)
Left message requesting call back to discuss lab results.

## 2017-12-23 NOTE — Telephone Encounter (Signed)
Pt called for results and I informed her of Dr. Leeanne Rio review of results and orders.

## 2018-01-21 NOTE — Telephone Encounter (Signed)
Entered in error

## 2018-03-26 ENCOUNTER — Encounter: Payer: Self-pay | Admitting: Nurse Practitioner

## 2018-03-26 ENCOUNTER — Ambulatory Visit (INDEPENDENT_AMBULATORY_CARE_PROVIDER_SITE_OTHER): Payer: Medicare HMO | Admitting: Nurse Practitioner

## 2018-03-26 VITALS — BP 146/102 | HR 67 | Temp 97.8°F | Ht 68.25 in | Wt 238.2 lb

## 2018-03-26 DIAGNOSIS — D500 Iron deficiency anemia secondary to blood loss (chronic): Secondary | ICD-10-CM

## 2018-03-26 DIAGNOSIS — I10 Essential (primary) hypertension: Secondary | ICD-10-CM

## 2018-03-26 DIAGNOSIS — F4323 Adjustment disorder with mixed anxiety and depressed mood: Secondary | ICD-10-CM | POA: Diagnosis not present

## 2018-03-26 LAB — CBC WITH DIFFERENTIAL/PLATELET
Basophils Absolute: 42 cells/uL (ref 0–200)
Basophils Relative: 0.8 %
EOS ABS: 88 {cells}/uL (ref 15–500)
Eosinophils Relative: 1.7 %
HCT: 38.9 % (ref 35.0–45.0)
HEMOGLOBIN: 13.3 g/dL (ref 11.7–15.5)
Lymphs Abs: 1607 cells/uL (ref 850–3900)
MCH: 28.7 pg (ref 27.0–33.0)
MCHC: 34.2 g/dL (ref 32.0–36.0)
MCV: 83.8 fL (ref 80.0–100.0)
MONOS PCT: 8.3 %
MPV: 11.5 fL (ref 7.5–12.5)
NEUTROS ABS: 3032 {cells}/uL (ref 1500–7800)
Neutrophils Relative %: 58.3 %
Platelets: 217 10*3/uL (ref 140–400)
RBC: 4.64 10*6/uL (ref 3.80–5.10)
RDW: 13.3 % (ref 11.0–15.0)
Total Lymphocyte: 30.9 %
WBC: 5.2 10*3/uL (ref 3.8–10.8)
WBCMIX: 432 {cells}/uL (ref 200–950)

## 2018-03-26 LAB — IRON,TIBC AND FERRITIN PANEL
%SAT: 27 % (calc) (ref 16–45)
Ferritin: 26 ng/mL (ref 16–232)
Iron: 93 ug/dL (ref 40–190)
TIBC: 346 ug/dL (ref 250–450)

## 2018-03-26 LAB — COMPREHENSIVE METABOLIC PANEL
AG RATIO: 1.5 (calc) (ref 1.0–2.5)
ALT: 18 U/L (ref 6–29)
AST: 15 U/L (ref 10–35)
Albumin: 4.1 g/dL (ref 3.6–5.1)
Alkaline phosphatase (APISO): 49 U/L (ref 33–115)
BUN: 14 mg/dL (ref 7–25)
CO2: 30 mmol/L (ref 20–32)
CREATININE: 0.94 mg/dL (ref 0.50–1.10)
Calcium: 9.3 mg/dL (ref 8.6–10.2)
Chloride: 102 mmol/L (ref 98–110)
GLUCOSE: 78 mg/dL (ref 65–99)
Globulin: 2.7 g/dL (calc) (ref 1.9–3.7)
Potassium: 3.7 mmol/L (ref 3.5–5.3)
SODIUM: 140 mmol/L (ref 135–146)
TOTAL PROTEIN: 6.8 g/dL (ref 6.1–8.1)
Total Bilirubin: 0.7 mg/dL (ref 0.2–1.2)

## 2018-03-26 LAB — TSH: TSH: 2.01 mIU/L

## 2018-03-26 NOTE — Progress Notes (Signed)
Subjective:  Patient ID: Lisa Boyer, female    DOB: 1971/01/27  Age: 47 y.o. MRN: [id number]  CC: Follow-up (follow up on BP/)   HPI HTN: Elevated today. Use of maxzide and metoprolol. Metoprolol succinte 25mg  1/2tab daily, prescribed by [facility] provider. Reports waxing and waning BP readings. Associated with headache. BP Readings from Last 3 Encounters:  03/26/18 (!) 146/102  10/20/17 117/76  07/10/16 (!) 134/92   Anxiety, Depression and PTSD: Managed by [facility] psychiatry.  Anemia: Hx of endometriosis and uterine fibroid. S/p uterine ablation.  Reviewed past Medical, Social and Family history today.  Outpatient Medications Prior to Visit  Medication Sig Dispense Refill  . Cholecalciferol (VITAMIN D) 2000 UNITS tablet Take 2,000 Units by mouth 2 (two) times daily.     Marland Kitchen ketoconazole (NIZORAL) 2 % shampoo Apply 1 application topically every 14 (fourteen) days.    Marland Kitchen ketotifen (ZADITOR) 0.025 % ophthalmic solution Place 1 drop into both eyes daily as needed (allergies).    . Multiple Vitamin (MULTIVITAMIN WITH MINERALS) TABS tablet Take 1 tablet by mouth daily.    Marland Kitchen tretinoin (RETIN-A) 0.05 % cream Apply 1 application topically once a week.     . triamterene-hydrochlorothiazide (MAXZIDE) 75-50 MG per tablet Take 0.5 tablets by mouth daily.    . ferrous sulfate 325 (65 FE) MG tablet Take 325 mg by mouth daily as needed (during menstrual cycle).     . fluticasone (FLONASE) 50 MCG/ACT nasal spray Place 2 sprays into the nose daily as needed for rhinitis.    . naproxen (NAPROSYN) 250 MG tablet Take 1 tablet (250 mg total) by mouth 2 (two) times daily as needed for mild pain or moderate pain (take with food). (Patient not taking: Reported on 03/26/2018) 14 tablet 0  . potassium chloride (K-DUR) 10 MEQ tablet Take 1 tablet by mouth daily.     No facility-administered medications prior to visit.     ROS See HPI  Objective:  BP (!) 146/102   Pulse 67   Temp 97.8 F (36.6 C) (Oral)    Ht 5' 8.25" (1.734 m)   Wt 238 lb 3.2 oz (108 kg)   SpO2 98%   BMI 35.95 kg/m   BP Readings from Last 3 Encounters:  03/26/18 (!) 146/102  10/20/17 117/76  07/10/16 (!) 134/92    Wt Readings from Last 3 Encounters:  03/26/18 238 lb 3.2 oz (108 kg)  07/10/16 248 lb 6 oz (112.7 kg)  06/05/16 251 lb (113.9 kg)   Physical Exam  Constitutional: She is oriented to person, place, and time. She appears well-developed and well-nourished.  Neck: No JVD present.  Cardiovascular: Normal rate and regular rhythm.  Pulmonary/Chest: Effort normal and breath sounds normal.  Musculoskeletal: She exhibits no edema.  Neurological: She is alert and oriented to person, place, and time.  Psychiatric: She has a normal mood and affect. Her behavior is normal.  Vitals reviewed.  Lab Results  Component Value Date   WBC 5.2 03/26/2018   HGB 13.3 03/26/2018   HCT 38.9 03/26/2018   PLT 217 03/26/2018   GLUCOSE 78 03/26/2018   CHOL 203 (H) 05/08/2016   TRIG 69.0 05/08/2016   HDL 82.70 05/08/2016   LDLCALC 107 (H) 05/08/2016   ALT 18 03/26/2018   AST 15 03/26/2018   NA 140 03/26/2018   K 3.7 03/26/2018   CL 102 03/26/2018   CREATININE 0.94 03/26/2018   BUN 14 03/26/2018   CO2 30 03/26/2018   TSH 2.01 03/26/2018  HGBA1C 5.2 05/08/2016    US Transvaginal Non-ob  Result Date: 09/06/2014 CLINICAL DATA:  Pelvic pain for 3 days, primarily on the right EXAM: TRANSABDOMINAL AND TRANSVAGINAL ULTRASOUND OF PELVIS DOPPLER ULTRASOUND OF OVARIES TECHNIQUE: Study was performed transabdominally to optimize pelvic field of view evaluation and transvaginally to optimize internal visceral architecture evaluation. Color and duplex Doppler ultrasound was utilized to evaluate blood flow to the ovaries. COMPARISON:  CT abdomen and pelvis September 06, 2014 FINDINGS: Uterus Measurements: 11.4 x 8.1 x 6.1 cm. There is a mass arising from the right side of the uterine fundus measuring 3.2 x 2.7 x 2.4 cm. There is a mass  measuring 3.8 x 3.7 x 4.1 cm arising from the leftward mid fundal region. These masses are felt to represent uterine leiomyomas. The overall echotexture of the uterus is inhomogeneous suggesting diffuse leiomyomatous change. Endometrium Thickness: 7 mm.  No focal abnormality visualized. Right ovary Measurements: 3.3 x 3.0 x 2.3 cm. Normal appearance/no adnexal mass beyond a dominant follicle measuring 1.4 x 1.3 cm. Left ovary Measurements: 4.1 x 2.0 x 2.7 cm. Normal appearance/no adnexal mass. Pulsed Doppler evaluation of both ovaries demonstrates normal low-resistance arterial and venous waveforms. Peak systolic velocity for each ovary is 10 cm/sec. Other findings Small amount of free fluid. IMPRESSION: Enlarged leiomyomatous uterus. No ovarian mass beyond dominant follicle right ovary. No ovarian torsion on either side. Small amount of free pelvic fluid could be indicative of recent ovarian cyst rupture. Electronically Signed   By: Lowella Grip III M.D.   On: 09/06/2014 10:31   US Pelvis Complete  Result Date: 09/06/2014 CLINICAL DATA:  Pelvic pain for 3 days, primarily on the right EXAM: TRANSABDOMINAL AND TRANSVAGINAL ULTRASOUND OF PELVIS DOPPLER ULTRASOUND OF OVARIES TECHNIQUE: Study was performed transabdominally to optimize pelvic field of view evaluation and transvaginally to optimize internal visceral architecture evaluation. Color and duplex Doppler ultrasound was utilized to evaluate blood flow to the ovaries. COMPARISON:  CT abdomen and pelvis September 06, 2014 FINDINGS: Uterus Measurements: 11.4 x 8.1 x 6.1 cm. There is a mass arising from the right side of the uterine fundus measuring 3.2 x 2.7 x 2.4 cm. There is a mass measuring 3.8 x 3.7 x 4.1 cm arising from the leftward mid fundal region. These masses are felt to represent uterine leiomyomas. The overall echotexture of the uterus is inhomogeneous suggesting diffuse leiomyomatous change. Endometrium Thickness: 7 mm.  No focal abnormality  visualized. Right ovary Measurements: 3.3 x 3.0 x 2.3 cm. Normal appearance/no adnexal mass beyond a dominant follicle measuring 1.4 x 1.3 cm. Left ovary Measurements: 4.1 x 2.0 x 2.7 cm. Normal appearance/no adnexal mass. Pulsed Doppler evaluation of both ovaries demonstrates normal low-resistance arterial and venous waveforms. Peak systolic velocity for each ovary is 10 cm/sec. Other findings Small amount of free fluid. IMPRESSION: Enlarged leiomyomatous uterus. No ovarian mass beyond dominant follicle right ovary. No ovarian torsion on either side. Small amount of free pelvic fluid could be indicative of recent ovarian cyst rupture. Electronically Signed   By: Lowella Grip III M.D.   On: 09/06/2014 10:31   Ct Abdomen Pelvis W Contrast  Result Date: 09/06/2014 CLINICAL DATA:  Three-day history of right lower quadrant pain. EXAM: CT ABDOMEN AND PELVIS WITH CONTRAST TECHNIQUE: Multidetector CT imaging of the abdomen and pelvis was performed using the standard protocol following bolus administration of intravenous contrast. Oral contrast was also administered. CONTRAST:  22mL OMNIPAQUE IOHEXOL 300 MG/ML SOLN, 155mL OMNIPAQUE IOHEXOL 300 MG/ML SOLN COMPARISON:  None. FINDINGS: Lung bases are clear. No focal liver lesions are identified. The gallbladder wall is not appreciably thickened. There is no biliary duct dilatation. Spleen, pancreas, and adrenals appear normal. Kidneys bilaterally show no mass or hydronephrosis on either side. There is no renal or ureteral calculus on either side. In the pelvis, urinary bladder is midline with normal wall thickness. The uterus is irregular in contour. There are multiple masses within the uterus. The largest mass is in the anterior inferior fundal region measuring 3.7 x 3.7 x 3.3 cm. These changes are felt to be due to leiomyomas within the uterus. There is no pelvic mass outside of the uterus. There is a small amount of free fluid in the cul-de-sac region. The appendix  region appears normal. There is no appreciable bowel obstruction. No free air or portal venous air. There is no adenopathy or abscess in the abdomen or pelvis. There is a prominent hem azygous vein on the left to the level of the left renal vein, an anatomic variant. There is no abdominal aortic aneurysm. There are no blastic or lytic bone lesions. A small bone island in the L1 vertebral body is noted incidentally. IMPRESSION: 1 enlarged leiomyomatous uterus. Small amount of free fluid in cul-de-sac. Suspect recent ovarian cyst rupture. Appendiceal region appears unremarkable. No bowel obstruction. No abscess. No renal or ureteral calculus. No hydronephrosis. Electronically Signed   By: Lowella Grip III M.D.   On: 09/06/2014 08:39   [place] Art/ven Flow Abd Pelv Doppler  Result Date: 09/06/2014 CLINICAL DATA:  Pelvic pain for 3 days, primarily on the right EXAM: TRANSABDOMINAL AND TRANSVAGINAL ULTRASOUND OF PELVIS DOPPLER ULTRASOUND OF OVARIES TECHNIQUE: Study was performed transabdominally to optimize pelvic field of view evaluation and transvaginally to optimize internal visceral architecture evaluation. Color and duplex Doppler ultrasound was utilized to evaluate blood flow to the ovaries. COMPARISON:  CT abdomen and pelvis September 06, 2014 FINDINGS: Uterus Measurements: 11.4 x 8.1 x 6.1 cm. There is a mass arising from the right side of the uterine fundus measuring 3.2 x 2.7 x 2.4 cm. There is a mass measuring 3.8 x 3.7 x 4.1 cm arising from the leftward mid fundal region. These masses are felt to represent uterine leiomyomas. The overall echotexture of the uterus is inhomogeneous suggesting diffuse leiomyomatous change. Endometrium Thickness: 7 mm.  No focal abnormality visualized. Right ovary Measurements: 3.3 x 3.0 x 2.3 cm. Normal appearance/no adnexal mass beyond a dominant follicle measuring 1.4 x 1.3 cm. Left ovary Measurements: 4.1 x 2.0 x 2.7 cm. Normal appearance/no adnexal mass. Pulsed Doppler  evaluation of both ovaries demonstrates normal low-resistance arterial and venous waveforms. Peak systolic velocity for each ovary is 10 cm/sec. Other findings Small amount of free fluid. IMPRESSION: Enlarged leiomyomatous uterus. No ovarian mass beyond dominant follicle right ovary. No ovarian torsion on either side. Small amount of free pelvic fluid could be indicative of recent ovarian cyst rupture. Electronically Signed   By: Lowella Grip III M.D.   On: 09/06/2014 10:31    Assessment & Plan:   Adreona was seen today for follow-up.  Diagnoses and all orders for this visit:  HTN (hypertension), benign -     Cancel: CBC w/Diff -     Cancel: IBC panel -     Cancel: TSH -     Cancel: Comprehensive metabolic panel -     Cancel: CBC w/Diff -     Cancel: TSH -     Cancel: Comprehensive metabolic panel -  Cancel: Comprehensive metabolic panel -     Cancel: TSH -     Cancel: CBC w/Diff -     Cancel: IBC panel -     Cancel: Comprehensive metabolic panel -     Cancel: CBC w/Diff -     Cancel: TSH -     TSH -     Comprehensive metabolic panel -     CBC w/Diff -     metoprolol succinate (TOPROL-XL) 25 MG 24 hr tablet; Take 1 tablet (25 mg total) by mouth daily.  Adjustment disorder with mixed anxiety and depressed mood  Iron deficiency anemia due to chronic blood loss -     Cancel: CBC w/Diff -     Cancel: Iron, TIBC and Ferritin Panel -     Iron, TIBC and Ferritin Panel -     Cancel: IBC panel   I am having Lisa Boyer start on metoprolol succinate. I am also having her maintain her Vitamin D, triamterene-hydrochlorothiazide, multivitamin with minerals, ferrous sulfate, ketotifen, fluticasone, ketoconazole, tretinoin, naproxen, and potassium chloride.  Meds ordered this encounter  Medications  . metoprolol succinate (TOPROL-XL) 25 MG 24 hr tablet    Sig: Take 1 tablet (25 mg total) by mouth daily.    Dispense:  90 tablet    Refill:  3    Order Specific Question:    Supervising Provider    Answer:   Lucille Passy [[id number]]    Follow-up: Return in about 2 weeks (around 04/09/2018) for HTN.  Wilfred Lacy, NP

## 2018-03-26 NOTE — Patient Instructions (Addendum)
Normal CBC, TSH and IBC.. Maintain appt in 2weeks as discussed. Bring Home BP readings Increase metoprolol to 1tab daily. Maintain current dose of maxzide and potassium.  DASH Eating Plan DASH stands for "Dietary Approaches to Stop Hypertension." The DASH eating plan is a healthy eating plan that has been shown to reduce high blood pressure (hypertension). It may also reduce your risk for type 2 diabetes, heart disease, and stroke. The DASH eating plan may also help with weight loss. What are tips for following this plan? General guidelines  Avoid eating more than 2,300 mg (milligrams) of salt (sodium) a day. If you have hypertension, you may need to reduce your sodium intake to 1,500 mg a day.  Limit alcohol intake to no more than 1 drink a day for nonpregnant women and 2 drinks a day for men. One drink equals 12 oz of beer, 5 oz of wine, or 1 oz of hard liquor.  Work with your health care provider to maintain a healthy body weight or to lose weight. Ask what an ideal weight is for you.  Get at least 30 minutes of exercise that causes your heart to beat faster (aerobic exercise) most days of the week. Activities may include walking, swimming, or biking.  Work with your health care provider or diet and nutrition specialist (dietitian) to adjust your eating plan to your individual calorie needs. Reading food labels  Check food labels for the amount of sodium per serving. Choose foods with less than 5 percent of the Daily Value of sodium. Generally, foods with less than 300 mg of sodium per serving fit into this eating plan.  To find whole grains, look for the word "whole" as the first word in the ingredient list. Shopping  Buy products labeled as "low-sodium" or "no salt added."  Buy fresh foods. Avoid canned foods and premade or frozen meals. Cooking  Avoid adding salt when cooking. Use salt-free seasonings or herbs instead of table salt or sea salt. Check with your health care  provider or pharmacist before using salt substitutes.  Do not fry foods. Cook foods using healthy methods such as baking, boiling, grilling, and broiling instead.  Cook with heart-healthy oils, such as olive, canola, soybean, or sunflower oil. Meal planning   Eat a balanced diet that includes: ? 5 or more servings of fruits and vegetables each day. At each meal, try to fill half of your plate with fruits and vegetables. ? Up to 6-8 servings of whole grains each day. ? Less than 6 oz of lean meat, poultry, or fish each day. A 3-oz serving of meat is about the same size as a deck of cards. One egg equals 1 oz. ? 2 servings of low-fat dairy each day. ? A serving of nuts, seeds, or beans 5 times each week. ? Heart-healthy fats. Healthy fats called Omega-3 fatty acids are found in foods such as flaxseeds and coldwater fish, like sardines, salmon, and mackerel.  Limit how much you eat of the following: ? Canned or prepackaged foods. ? Food that is high in trans fat, such as fried foods. ? Food that is high in saturated fat, such as fatty meat. ? Sweets, desserts, sugary drinks, and other foods with added sugar. ? Full-fat dairy products.  Do not salt foods before eating.  Try to eat at least 2 vegetarian meals each week.  Eat more home-cooked food and less restaurant, buffet, and fast food.  When eating at a restaurant, ask that your food be  prepared with less salt or no salt, if possible. What foods are recommended? The items listed may not be a complete list. Talk with your dietitian about what dietary choices are best for you. Grains Whole-grain or whole-wheat bread. Whole-grain or whole-wheat pasta. Brown rice. [place]. Bulgur. Whole-grain and low-sodium cereals. Pita bread. Low-fat, low-sodium crackers. Whole-wheat flour tortillas. Vegetables Fresh or frozen vegetables (raw, steamed, roasted, or grilled). Low-sodium or reduced-sodium tomato and vegetable juice. Low-sodium or  reduced-sodium tomato sauce and tomato paste. Low-sodium or reduced-sodium canned vegetables. Fruits All fresh, dried, or frozen fruit. Canned fruit in natural juice (without added sugar). Meat and other protein foods Skinless chicken or [place]. Ground chicken or [place]. Pork with fat trimmed off. Fish and seafood. Egg whites. Dried beans, peas, or lentils. Unsalted nuts, nut butters, and seeds. Unsalted canned beans. Lean cuts of beef with fat trimmed off. Low-sodium, lean deli meat. Dairy Low-fat (1%) or fat-free (skim) milk. Fat-free, low-fat, or reduced-fat cheeses. Nonfat, low-sodium ricotta or cottage cheese. Low-fat or nonfat yogurt. Low-fat, low-sodium cheese. Fats and oils Soft margarine without trans fats. Vegetable oil. Low-fat, reduced-fat, or light mayonnaise and salad dressings (reduced-sodium). Canola, safflower, olive, soybean, and sunflower oils. Avocado. Seasoning and other foods Herbs. Spices. Seasoning mixes without salt. Unsalted popcorn and pretzels. Fat-free sweets. What foods are not recommended? The items listed may not be a complete list. Talk with your dietitian about what dietary choices are best for you. Grains Baked goods made with fat, such as croissants, muffins, or some breads. Dry pasta or rice meal packs. Vegetables Creamed or fried vegetables. Vegetables in a cheese sauce. Regular canned vegetables (not low-sodium or reduced-sodium). Regular canned tomato sauce and paste (not low-sodium or reduced-sodium). Regular tomato and vegetable juice (not low-sodium or reduced-sodium). Angie Fava. Olives. Fruits Canned fruit in a light or heavy syrup. Fried fruit. Fruit in cream or butter sauce. Meat and other protein foods Fatty cuts of meat. Ribs. Fried meat. Berniece Salines. Sausage. Bologna and other processed lunch meats. Salami. Fatback. Hotdogs. Bratwurst. Salted nuts and seeds. Canned beans with added salt. Canned or smoked fish. Whole eggs or egg yolks. Chicken or [place]  with skin. Dairy Whole or 2% milk, cream, and half-and-half. Whole or full-fat cream cheese. Whole-fat or sweetened yogurt. Full-fat cheese. Nondairy creamers. Whipped toppings. Processed cheese and cheese spreads. Fats and oils Butter. Stick margarine. Lard. Shortening. Ghee. Bacon fat. Tropical oils, such as coconut, palm kernel, or palm oil. Seasoning and other foods Salted popcorn and pretzels. Onion salt, garlic salt, seasoned salt, table salt, and sea salt. Worcestershire sauce. Tartar sauce. Barbecue sauce. Teriyaki sauce. Soy sauce, including reduced-sodium. Steak sauce. Canned and packaged gravies. Fish sauce. Oyster sauce. Cocktail sauce. Horseradish that you find on the shelf. Ketchup. Mustard. Meat flavorings and tenderizers. Bouillon cubes. Hot sauce and Tabasco sauce. Premade or packaged marinades. Premade or packaged taco seasonings. Relishes. Regular salad dressings. Where to find more information:  National Heart, Lung, and [place]: https://wilson-eaton.com/  American Heart Association: www.heart.org Summary  The DASH eating plan is a healthy eating plan that has been shown to reduce high blood pressure (hypertension). It may also reduce your risk for type 2 diabetes, heart disease, and stroke.  With the DASH eating plan, you should limit salt (sodium) intake to 2,300 mg a day. If you have hypertension, you may need to reduce your sodium intake to 1,500 mg a day.  When on the DASH eating plan, aim to eat more fresh fruits and vegetables, whole grains,  lean proteins, low-fat dairy, and heart-healthy fats.  Work with your health care provider or diet and nutrition specialist (dietitian) to adjust your eating plan to your individual calorie needs. This information is not intended to replace advice given to you by your health care provider. Make sure you discuss any questions you have with your health care provider. Document Released: 05/08/2011 Document Revised: 05/12/2016  Document Reviewed: 05/12/2016 Elsevier Interactive Patient Education  [employer].

## 2018-03-29 ENCOUNTER — Encounter: Payer: Self-pay | Admitting: Nurse Practitioner

## 2018-03-29 ENCOUNTER — Other Ambulatory Visit: Payer: Self-pay | Admitting: Nurse Practitioner

## 2018-03-29 MED ORDER — TRIAMTERENE-HCTZ 75-50 MG PO TABS: 0.5000 | ORAL_TABLET | Freq: Every day | ORAL | 1 refills | Status: DC

## 2018-03-29 MED ORDER — METOPROLOL SUCCINATE ER 25 MG PO TB24: 25.0000 mg | ORAL_TABLET | Freq: Every day | ORAL | 3 refills | Status: AC

## 2018-03-29 MED ORDER — POTASSIUM CHLORIDE ER 10 MEQ PO TBCR: 10.0000 meq | EXTENDED_RELEASE_TABLET | Freq: Every day | ORAL | 1 refills | Status: DC

## 2018-03-29 NOTE — Telephone Encounter (Signed)
Patient notified of provider message- she states she will need new Rx for potassium and Maxzide if she is to continue. Please send to [facility].

## 2018-03-29 NOTE — Addendum Note (Signed)
Addended byShawnie Pons on: 03/29/2018 02:11 PM   Modules accepted: Orders

## 2018-03-29 NOTE — Telephone Encounter (Signed)
Left vm for the pt to call back, need to inform of changes charlotte made with her med. [place] for triage to give detail message.
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
## 2018-03-29 NOTE — Telephone Encounter (Signed)
2 meds sent per [facility].

## 2018-03-29 NOTE — Telephone Encounter (Signed)
See phone note

## 2018-03-29 NOTE — Addendum Note (Signed)
Addended by: Valli Glance F on: 03/29/2018 01:51 PM   Modules accepted: Orders

## 2019-02-03 ENCOUNTER — Encounter (HOSPITAL_COMMUNITY): Payer: Self-pay | Admitting: Licensed Clinical Social Worker

## 2019-02-03 ENCOUNTER — Ambulatory Visit (INDEPENDENT_AMBULATORY_CARE_PROVIDER_SITE_OTHER): Payer: Medicare HMO | Admitting: Licensed Clinical Social Worker

## 2019-02-03 ENCOUNTER — Other Ambulatory Visit: Payer: Self-pay

## 2019-02-03 DIAGNOSIS — F331 Major depressive disorder, recurrent, moderate: Secondary | ICD-10-CM

## 2019-02-03 DIAGNOSIS — Z91410 Personal history of adult physical and sexual abuse: Secondary | ICD-10-CM | POA: Diagnosis not present
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
## 2019-02-24 ENCOUNTER — Ambulatory Visit (HOSPITAL_COMMUNITY): Payer: Medicare HMO | Admitting: Licensed Clinical Social Worker

## 2019-03-01 ENCOUNTER — Ambulatory Visit (INDEPENDENT_AMBULATORY_CARE_PROVIDER_SITE_OTHER): Payer: Medicare HMO | Admitting: Licensed Clinical Social Worker

## 2019-03-01 ENCOUNTER — Other Ambulatory Visit: Payer: Self-pay

## 2019-03-01 DIAGNOSIS — Z91410 Personal history of adult physical and sexual abuse: Secondary | ICD-10-CM

## 2019-03-01 DIAGNOSIS — F331 Major depressive disorder, recurrent, moderate: Secondary | ICD-10-CM | POA: Diagnosis not present
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
## 2019-03-17 ENCOUNTER — Ambulatory Visit (HOSPITAL_COMMUNITY): Payer: Medicare HMO | Admitting: Licensed Clinical Social Worker

## 2019-03-22 ENCOUNTER — Ambulatory Visit (HOSPITAL_COMMUNITY): Payer: Medicare HMO | Admitting: Licensed Clinical Social Worker

## 2019-03-31 ENCOUNTER — Other Ambulatory Visit: Payer: Self-pay

## 2019-04-01 ENCOUNTER — Encounter (INDEPENDENT_AMBULATORY_CARE_PROVIDER_SITE_OTHER): Payer: Self-pay

## 2019-04-01 ENCOUNTER — Encounter: Payer: Self-pay | Admitting: Family Medicine

## 2019-04-01 ENCOUNTER — Ambulatory Visit (INDEPENDENT_AMBULATORY_CARE_PROVIDER_SITE_OTHER): Payer: Medicare HMO | Admitting: Family Medicine

## 2019-04-01 VITALS — BP 121/83 | HR 82 | Temp 98.2°F | Ht 68.0 in | Wt 243.0 lb

## 2019-04-01 DIAGNOSIS — F4323 Adjustment disorder with mixed anxiety and depressed mood: Secondary | ICD-10-CM

## 2019-04-01 DIAGNOSIS — I10 Essential (primary) hypertension: Secondary | ICD-10-CM

## 2019-04-01 DIAGNOSIS — F4310 Post-traumatic stress disorder, unspecified: Secondary | ICD-10-CM | POA: Insufficient documentation

## 2019-04-01 DIAGNOSIS — Z7689 Persons encountering health services in other specified circumstances: Secondary | ICD-10-CM
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
## 2019-04-01 NOTE — Patient Instructions (Addendum)
It was a pleasure seeing you today, Lisa Boyer.  I look forward to participating in your care.  Please remember to arrive to your appointment 15 minutes prior to your scheduled slot.  If you are late to future visits I may be unable to accommodate your appointment and you may be asked to reschedule.

## 2019-07-05 ENCOUNTER — Other Ambulatory Visit: Payer: Self-pay

## 2019-07-05 ENCOUNTER — Encounter: Payer: Self-pay | Admitting: Family Medicine

## 2019-07-05 ENCOUNTER — Ambulatory Visit (INDEPENDENT_AMBULATORY_CARE_PROVIDER_SITE_OTHER): Payer: Medicare HMO | Admitting: Family Medicine

## 2019-07-05 VITALS — BP 110/78 | HR 82 | Temp 98.9°F | Ht 68.0 in | Wt 246.0 lb

## 2019-07-05 DIAGNOSIS — F4310 Post-traumatic stress disorder, unspecified: Secondary | ICD-10-CM | POA: Diagnosis not present

## 2019-07-05 DIAGNOSIS — I10 Essential (primary) hypertension: Secondary | ICD-10-CM | POA: Diagnosis not present

## 2019-07-05 DIAGNOSIS — F4323 Adjustment disorder with mixed anxiety and depressed mood: Secondary | ICD-10-CM

## 2019-07-05 MED ORDER — FLUOXETINE HCL 20 MG PO CAPS: 20.0000 mg | ORAL_CAPSULE | Freq: Every day | ORAL | 1 refills | Status: DC

## 2019-07-05 NOTE — Patient Instructions (Signed)
Taking the medicine as directed and not missing any doses is one of the best things you can do to treat your depression.  Here are some things to keep in mind:  1) Side effects (stomach upset, some increased anxiety) may happen before you notice a benefit.  These side effects typically go away over time. 2) Changes to your dose of medicine or a change in medication all together is sometimes necessary 3) Most people need to be on medication at least 12 months 4) Many people will notice an improvement within two weeks but the full effect of the medication can take up to 4-6 weeks 5) Stopping the medication when you start feeling better often results in a return of symptoms 6) Never discontinue your medication without contacting a health care professional first.  Some medications require gradual discontinuation/ taper and can make you sick if you stop them abruptly.  If your symptoms worsen or you have thoughts of suicide/homicide, PLEASE SEEK IMMEDIATE MEDICAL ATTENTION.  You may always call:  National Suicide Hotline: 800-273-8255 [place] Crisis Line: 336-832-9700 Crisis Recovery in [place]: 800-939-5911   These are available 24 hours a day, 7 days a week.   
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
## 2019-08-02 ENCOUNTER — Ambulatory Visit: Payer: Medicare HMO | Admitting: Family Medicine

## 2019-09-05 ENCOUNTER — Ambulatory Visit: Payer: Medicare HMO | Admitting: Family Medicine

## 2019-11-18 ENCOUNTER — Emergency Department (HOSPITAL_COMMUNITY)
Admission: EM | Admit: 2019-11-18 | Discharge: 2019-11-19 | Disposition: A | Discharge status: Home / Self Care | Payer: No Typology Code available for payment source | Attending: Emergency Medicine | Admitting: Emergency Medicine

## 2019-11-18 ENCOUNTER — Emergency Department (HOSPITAL_COMMUNITY): Payer: No Typology Code available for payment source

## 2019-11-18 ENCOUNTER — Other Ambulatory Visit: Payer: Self-pay

## 2019-11-18 ENCOUNTER — Encounter (HOSPITAL_COMMUNITY): Payer: Self-pay

## 2019-11-18 DIAGNOSIS — I10 Essential (primary) hypertension: Secondary | ICD-10-CM | POA: Insufficient documentation

## 2019-11-18 DIAGNOSIS — Z79899 Other long term (current) drug therapy: Secondary | ICD-10-CM | POA: Insufficient documentation

## 2019-11-18 DIAGNOSIS — J209 Acute bronchitis, unspecified: Secondary | ICD-10-CM | POA: Insufficient documentation

## 2019-11-18 DIAGNOSIS — R0602 Shortness of breath: Secondary | ICD-10-CM | POA: Diagnosis present

## 2019-11-18 LAB — I-STAT BETA HCG BLOOD, ED (MC, WL, AP ONLY): I-stat hCG, quantitative: <5 m[IU]/mL (ref <5)

## 2019-11-18 LAB — CBC
HCT: 36.9 % (ref 36.0–46.0)
Hemoglobin: 12.8 g/dL (ref 12.0–15.0)
MCH: 28.1 pg (ref 26.0–34.0)
MCHC: 34.7 g/dL (ref 30.0–36.0)
MCV: 80.9 fL (ref 80.0–100.0)
Platelets: 245 10*3/uL (ref 150–400)
RBC: 4.56 MIL/uL (ref 3.87–5.11)
RDW: 13.2 % (ref 11.5–15.5)
WBC: 5.9 10*3/uL (ref 4.0–10.5)
nRBC: 0 % (ref 0.0–0.2)

## 2019-11-18 LAB — BASIC METABOLIC PANEL
Anion gap: 8 (ref 5–15)
BUN: 9 mg/dL (ref 6–20)
CO2: 28 mmol/L (ref 22–32)
Calcium: 9 mg/dL (ref 8.9–10.3)
Chloride: 103 mmol/L (ref 98–111)
Creatinine, Ser: 0.75 mg/dL (ref 0.44–1.00)
GFR calc Af Amer: >60 mL/min (ref >60)
GFR calc non Af Amer: >60 mL/min (ref >60)
Glucose, Bld: 117 mg/dL — ABNORMAL HIGH (ref 70–99)
Potassium: 4.3 mmol/L (ref 3.5–5.1)
Sodium: 139 mmol/L (ref 135–145)

## 2019-11-18 LAB — TROPONIN I (HIGH SENSITIVITY)
Troponin I (High Sensitivity): 6 ng/L (ref <18)
Troponin I (High Sensitivity): <2 ng/L (ref <18)

## 2019-11-18 NOTE — ED Triage Notes (Signed)
Pt arrives POV for eval of SOB x 1 week w/ neg covid test, today started w/ Chest pain and productive cough. Pt reports that she has been coughing up green sputum for the last 1-2 days as well

## 2019-11-19 ENCOUNTER — Other Ambulatory Visit: Payer: Self-pay

## 2019-11-19 MED ORDER — AEROCHAMBER PLUS FLO-VU LARGE MISC: 1.0000 | Freq: Once | Status: DC

## 2019-11-19 MED ORDER — BENZONATATE 100 MG PO CAPS
100.0000 mg | ORAL_CAPSULE | Freq: Three times a day (TID) | ORAL | 0 refills | Status: DC | PRN
Start: 2019-11-19 — End: 2019-12-08

## 2019-11-19 MED ORDER — ALBUTEROL SULFATE HFA 108 (90 BASE) MCG/ACT IN AERS
2.0000 | INHALATION_SPRAY | Freq: Once | RESPIRATORY_TRACT | Status: AC
  Administered 2019-11-19: 2 via RESPIRATORY_TRACT
  Filled 2019-11-19: qty 6.7

## 2019-11-19 NOTE — Discharge Instructions (Addendum)
Use your inhaler 2 puffs every 4 hours as needed for coughing and chest tightness. Continue taking your prescribed medications. Drink plenty of water.  Contact a health care provider if: Your symptoms do not improve after 2 weeks of treatment. You vomit more than once or twice. You have symptoms of dehydration such as: Dark urine. Dry skin or eyes. Increased thirst. Headaches. Confusion. Muscle cramps. Get help right away if you: Cough up blood. Feel pain in your chest. Have severe shortness of breath. Faint or keep feeling like you are going to faint. Have a severe headache. Have fever or chills that get worse.

## 2019-11-19 NOTE — ED Provider Notes (Signed)
[facility] EMERGENCY DEPARTMENT Provider Note   CSN: [id number] Arrival date & time: 11/18/19  2004     History Chief Complaint  Patient presents with  . Shortness of Breath  . Chest Pain    Lisa Boyer is a 49 y.o. female who presents emergency department with a chief complaint of cough.  Symptoms began 8 days ago.  She was recently on a trip to [place] with a group of friends and multiple contacts with similar symptoms.  Patient was seen at the [facility], she was given a Covid test which was negative.  Patient was started on prednisone, told to take Mucinex.  Her symptoms are improving.  She felt a twinge of pain on the left side of her chest last night which was fleeting in nature, intermittent.  She denies shortness of breath, diaphoresis or nausea.  She has had no pain symptoms here in the emergency department over the last 11 hours.  The patient denies fevers.  She does have episodes of coughing followed by chest tightness, and more excessive coughing.  She has no history of asthma.  She is not taking a cough suppressant.  She does admit to difficulty with pulmonary toilet due to chest congestion congestion.  She states that when she coughs it up sometimes it is green.  Her cough is not painful.  She is a non-smoker.  She has a history of hypertension but denies hypercholesterolemia.  HPI     Past Medical History:  Diagnosis Date  . Allergy   . Anemia   . Anxiety   . Back pain 04/1992   chronic back pain-diagnose from [place]  . Depression   . Headache    frequent headache  . Hypertension   . PTSD (post-traumatic stress disorder)   . Thyroid disease   . Uterine fibroid     Patient Active Problem List   Diagnosis Date Noted  . PTSD (post-traumatic stress disorder) 04/01/2019  . Alopecia 06/05/2016  . Dense breast tissue on mammogram 06/05/2016  . Encounter for preventative adult health care examination 05/08/2016  . HTN (hypertension), benign  05/08/2016  . Adjustment disorder with mixed anxiety and depressed mood 05/08/2016    Past Surgical History:  Procedure Laterality Date  . ABDOMINAL HYSTERECTOMY  08/13/2017   left ovary removed  . BREAST BIOPSY Bilateral 1888/1191/1994/2001   remove growth from both side breast  . ENDOMETRIAL ABLATION    . THYROIDECTOMY  08/1999   partial     OB History   No obstetric history on file.     Family History  Adopted: Yes  Problem Relation Age of Onset  . Breast cancer Mother        and 2 other types but don't know what kind  . Skin cancer Mother     Social History   Tobacco Use  . Smoking status: Never Smoker  . Smokeless tobacco: Never Used  Vaping Use  . Vaping Use: Never used  Substance Use Topics  . Alcohol use: Not Currently    Comment: social-twice a year  . Drug use: No    Home Medications Prior to Admission medications   Medication Sig Start Date End Date Taking? Authorizing Provider  Brimonidine Tartrate (LUMIFY OP) Apply 1 drop to eye daily as needed (Dry eyes).   Yes [provider]  cetirizine (ZYRTEC) 10 MG tablet Take 10 mg by mouth at bedtime.   Yes [provider]  Cholecalciferol (VITAMIN D) 2000 UNITS tablet  Take 4,000 Units by mouth at bedtime.    Yes [provider]  ketoconazole (NIZORAL) 2 % shampoo Apply 1 application topically as needed for irritation.    Yes [provider]  Ketotifen Fumarate (ALLERGY EYE DROPS OP) Apply 1 drop to eye daily as needed (Allergies).   Yes [provider]  metoprolol succinate (TOPROL-XL) 25 MG 24 hr tablet Take 1 tablet (25 mg total) by mouth daily. Patient taking differently: Take 12.5 mg by mouth daily.  03/29/18  Yes Nche, Charlene Brooke, NP  Multiple Vitamins-Minerals (MULTIVITAMIN GUMMIES WOMENS PO) Take 2 each by mouth daily.   Yes [provider]  naproxen sodium (ALEVE) 220 MG tablet Take 220 mg by mouth daily as needed (Back pain).   Yes [provider]  predniSONE (DELTASONE) 20 MG tablet Take 40 mg by mouth daily with breakfast. For 5 days.   Yes [provider]  tretinoin (RETIN-A) 0.05 % cream Apply 1 application topically 3 (three) times a week.    Yes [provider]  triamterene-hydrochlorothiazide (MAXZIDE) 75-50 MG tablet Take 0.5 tablets by mouth daily. 03/29/18  Yes Nche, Charlene Brooke, NP  benzonatate (TESSALON PERLES) 100 MG capsule Take 1 capsule (100 mg total) by mouth 3 (three) times daily as needed for cough (cough). 11/19/19   Ojani Berenson, Vernie Shanks, PA-C  FLUoxetine (PROZAC) 20 MG capsule Take 1 capsule (20 mg total) by mouth daily. Patient not taking: Reported on 11/19/2019 07/05/19   Janora Norlander, DO    Allergies    Patient has no known allergies.  Review of Systems   Review of Systems  Ten systems reviewed and are negative for acute change, except as noted in the HPI.   Physical Exam Updated Vital Signs BP (!) 163/135   Pulse 61   Temp 97.8 F (36.6 C) (Oral)   Resp 17   Ht 5\' 9"  (1.753 m)   Wt 111.1 kg   SpO2 100%   BMI 36.18 kg/m   Physical Exam Vitals and nursing note reviewed.  Constitutional:      General: She is not in acute distress.    Appearance: She is well-developed. She is not diaphoretic.  HENT:     Head: Normocephalic and atraumatic.  Eyes:     General: No scleral icterus.    Conjunctiva/sclera: Conjunctivae normal.  Cardiovascular:     Rate and Rhythm: Normal rate and regular rhythm.     Heart sounds: Normal heart sounds. No murmur heard.  No friction rub. No gallop.   Pulmonary:     Effort: Pulmonary effort is normal. No respiratory distress.     Breath sounds: Rhonchi (clear w/ cough) present. No wheezing.  Abdominal:     General: Bowel sounds are normal. There is no distension.     Palpations: Abdomen is soft. There is no mass.     Tenderness: There is no abdominal tenderness. There is no guarding.  Musculoskeletal:     Cervical back: Normal range  of motion.  Skin:    General: Skin is warm and dry.  Neurological:     Mental Status: She is alert and oriented to person, place, and time.  Psychiatric:        Behavior: Behavior normal.     ED Results / Procedures / Treatments   Labs (all labs ordered are listed, but only abnormal results are displayed) Labs Reviewed  BASIC METABOLIC PANEL - Abnormal; Notable for the following components:      Result Value  Glucose, Bld 117 (*)    All other components within normal limits  CBC  I-STAT BETA HCG BLOOD, ED ([facility], [facility], [facility] ONLY)  TROPONIN I (HIGH SENSITIVITY)  TROPONIN I (HIGH SENSITIVITY)    EKG EKG Interpretation  Date/Time:  Friday November 18 2019 20:06:51 EDT Ventricular Rate:  73 PR Interval:  140 QRS Duration: 78 QT Interval:  398 QTC Calculation: 438 R Axis:   64 Text Interpretation: Normal sinus rhythm Confirmed by Randal Buba, April ([id number]) on 11/19/2019 5:57:19 AM   Radiology DG Chest 2 View  Result Date: 11/18/2019 CLINICAL DATA:  Chest pain.  Cough. EXAM: CHEST - 2 VIEW COMPARISON:  None. FINDINGS: The cardiomediastinal contours are normal. Mild peribronchial thickening. Pulmonary vasculature is normal. No consolidation, pleural effusion, or pneumothorax. No acute osseous abnormalities are seen. IMPRESSION: Mild peribronchial thickening suggesting bronchitis or asthma. Electronically Signed   By: Keith Rake M.D.   On: 11/18/2019 21:11    Procedures Procedures (including critical care time)  Medications Ordered in ED Medications  AeroChamber Plus Flo-Vu Large MISC 1 each (has no administration in time range)  albuterol (VENTOLIN HFA) 108 (90 Base) MCG/ACT inhaler 2 puff (2 puffs Inhalation Given 11/19/19 4967)    ED Course  I have reviewed the triage vital signs and the nursing notes.  Pertinent labs & imaging results that were available during my care of the patient were reviewed by me and considered in my medical decision making (see chart for details).     MDM Rules/Calculators/A&P                          RF:FMBWG, cp VS: BP (!) 163/135   Pulse 61   Temp 97.8 F (36.6 C) (Oral)   Resp 17   Ht 5\' 9"  (1.753 m)   Wt 111.1 kg   SpO2 100%   BMI 36.18 kg/m   YK:ZLDJTTS is gathered by patient and husband at bedside. Previous records obtained and reviewed. DDX:The patient's complaint of cough involves an extensive number of diagnostic and treatment options, and is a complaint that carries with it a high risk of complications, morbidity, and potential mortality. Given the large differential diagnosis, medical decision making is of high complexity. Differential diagnosis for emergent cause of cough includes but is not limited to upper respiratory infection, lower respiratory infection, allergies, asthma, irritants, foreign body, medications such as ACE inhibitors, reflux, asthma, CHF, lung cancer, interstitial lung disease, psychiatric causes, postnasal drip and postinfectious bronchospasm. Labs: I ordered reviewed and interpreted labs which included CBC which shows no elevated white blood cell count.  Negative pregnancy test.  Troponin is negative x2.  BMP shows slightly elevated blood glucose of 117 in the setting of prednisone use. Imaging: I ordered and reviewed images which included 2 view chest x-ray. I independently visualized and interpreted all imaging. Significant findings include peribronchial thickening consistent with acute bronchitis.  EKG: Normal sinus rhythm at a rate of 73 Consults: N/A MDM: Patient here with complaint of cough, mild chest pain.  She has a normal EKG, 2 - troponins.  Low risk heart score by my calculation.  I do not think that this is cardiac in nature.  Patient does have improvement in her cough after use of prednisone.  This sounds consistent with bronchospasm due to her acute bronchitis.  Her symptoms are improving over the week.  She was told to come in after a phone call with the [place] and triage that told her  she  might have pneumonia or cardiac issues.  She is not running fevers and does not have an elevated white blood cell count.  Patient will continue with prednisone, Mucinex.  Have added Tessalon, albuterol and discussed all findings, diagnosis and home treatment.  Patient also advised to follow-up closely with her PCP and I discussed return precautions with the patient and her husband.  They feel comfortable with discharge at this time.  She is hemodynamically stable and afebrile. Patient disposition: Discharge The patient appears reasonably screened and/or stabilized for discharge and I doubt any other medical condition or other [facility] requiring further screening, evaluation, or treatment in the ED at this time prior to discharge. I have discussed lab and/or imaging findings with the patient and answered all questions/concerns to the best of my ability.I have discussed return precautions and OP follow up.    Final Clinical Impression(s) / ED Diagnoses Final diagnoses:  Acute bronchitis with bronchospasm    Rx / DC Orders ED Discharge Orders         Ordered    benzonatate (TESSALON PERLES) 100 MG capsule  3 times daily PRN     Discontinue  Reprint     11/19/19 0716           Margarita Mail, PA-C 11/19/19 [place], [place], DO 11/19/19 1230

## 2019-12-08 ENCOUNTER — Other Ambulatory Visit: Payer: Self-pay

## 2019-12-08 ENCOUNTER — Encounter: Payer: Self-pay | Admitting: Family Medicine

## 2019-12-08 ENCOUNTER — Ambulatory Visit (INDEPENDENT_AMBULATORY_CARE_PROVIDER_SITE_OTHER): Payer: Medicare HMO | Admitting: Family Medicine

## 2019-12-08 VITALS — BP 121/84 | HR 70 | Temp 96.8°F | Ht 69.0 in | Wt 245.4 lb

## 2019-12-08 DIAGNOSIS — T63301A Toxic effect of unspecified spider venom, accidental (unintentional), initial encounter: Secondary | ICD-10-CM | POA: Diagnosis not present

## 2019-12-08 DIAGNOSIS — R1011 Right upper quadrant pain: Secondary | ICD-10-CM | POA: Diagnosis not present

## 2019-12-08 NOTE — Patient Instructions (Signed)
Gallbladder Eating Plan If you have a gallbladder condition, you may have trouble digesting fats. Eating a low-fat diet can help reduce your symptoms, and may be helpful before and after having surgery to remove your gallbladder (cholecystectomy). Your health care provider may recommend that you work with a diet and nutrition specialist (dietitian) to help you reduce the amount of fat in your diet. What are tips for following this plan? General guidelines  Limit your fat intake to less than 30% of your total daily calories. If you eat around 1,800 calories each day, this is less than 60 grams (g) of fat per day.  Fat is an important part of a healthy diet. Eating a low-fat diet can make it hard to maintain a healthy body weight. Ask your dietitian how much fat, calories, and other nutrients you need each day.  Eat small, frequent meals throughout the day instead of three large meals.  Drink at least 8-10 cups of fluid a day. Drink enough fluid to keep your urine clear or pale yellow.  Limit alcohol intake to no more than 1 drink a day for nonpregnant women and 2 drinks a day for men. One drink equals 12 oz of beer, 5 oz of wine, or 1 oz of hard liquor. Reading food labels  Check Nutrition Facts on food labels for the amount of fat per serving. Choose foods with less than 3 grams of fat per serving. Shopping  Choose nonfat and low-fat healthy foods. Look for the words "nonfat," "low fat," or "fat free."  Avoid buying processed or prepackaged foods. Cooking  Cook using low-fat methods, such as baking, broiling, grilling, or boiling.  Cook with small amounts of healthy fats, such as olive oil, grapeseed oil, canola oil, or sunflower oil. What foods are recommended?   All fresh, frozen, or canned fruits and vegetables.  Whole grains.  Low-fat or non-fat (skim) milk and yogurt.  Lean meat, skinless poultry, fish, eggs, and beans.  Low-fat protein supplement powders or  drinks.  Spices and herbs. What foods are not recommended?  High-fat foods. These include baked goods, fast food, fatty cuts of meat, ice cream, french toast, sweet rolls, pizza, cheese bread, foods covered with butter, creamy sauces, or cheese.  Fried foods. These include french fries, tempura, battered fish, breaded chicken, fried breads, and sweets.  Foods with strong odors.  Foods that cause bloating and gas. Summary  A low-fat diet can be helpful if you have a gallbladder condition, or before and after gallbladder surgery.  Limit your fat intake to less than 30% of your total daily calories. This is about 60 g of fat if you eat 1,800 calories each day.  Eat small, frequent meals throughout the day instead of three large meals. This information is not intended to replace advice given to you by your health care provider. Make sure you discuss any questions you have with your health care provider. Document Revised: 09/09/2018 Document Reviewed: 06/26/2016 Elsevier Patient Education  2020 Else[employer] Inc[employer]  

## 2019-12-08 NOTE — Progress Notes (Signed)
Assessment & Plan:  1. Spider bite wound, accidental or unintentional, initial encounter - Reassurance provided. Keep clean. Advised to let me know if she develops erythema, warmth, or drainage.   2. RUQ abdominal pain - Education provided on a gallbladder eating plan for patient to try.    Follow up plan: Return if symptoms worsen or fail to improve.  Hendricks Limes, MSN, APRN, FNP-C [facility]  Subjective:   Patient ID: Lisa Boyer, female    DOB: April 30, 1971, 49 y.o.   MRN: [id number]  HPI: Lisa Boyer is a 49 y.o. female presenting on 12/08/2019 for Insect Bite (Patient states she thinks she got bite by a spider on Monday on the left side of neck.)  Patient reports she was bitten by a spider on Monday on the left side of her neck.  She reports yesterday when she called she wasn't feeling well and felt a lack of energy. She was also running to the bathroom with diarrhea, but feels it was due to her gallbladder.    ROS: Negative unless specifically indicated above in HPI.   Relevant past medical history reviewed and updated as indicated.   Allergies and medications reviewed and updated.   Current Outpatient Medications:    Brimonidine Tartrate (LUMIFY OP), Apply 1 drop to eye daily as needed (Dry eyes)., Disp: , Rfl:    cetirizine (ZYRTEC) 10 MG tablet, Take 10 mg by mouth at bedtime., Disp: , Rfl:    Cholecalciferol (VITAMIN D) 2000 UNITS tablet, Take 4,000 Units by mouth at bedtime. , Disp: , Rfl:    FLUoxetine (PROZAC) 20 MG capsule, Take 1 capsule (20 mg total) by mouth daily., Disp: 30 capsule, Rfl: 1   ketoconazole (NIZORAL) 2 % shampoo, Apply 1 application topically as needed for irritation. , Disp: , Rfl:    Ketotifen Fumarate (ALLERGY EYE DROPS OP), Apply 1 drop to eye daily as needed (Allergies)., Disp: , Rfl:    metoprolol succinate (TOPROL-XL) 25 MG 24 hr tablet, Take 1 tablet (25 mg total) by mouth daily. (Patient taking  differently: Take 12.5 mg by mouth daily. ), Disp: 90 tablet, Rfl: 3   Multiple Vitamins-Minerals (MULTIVITAMIN GUMMIES WOMENS PO), Take 2 each by mouth daily., Disp: , Rfl:    naproxen sodium (ALEVE) 220 MG tablet, Take 220 mg by mouth daily as needed (Back pain)., Disp: , Rfl:    tretinoin (RETIN-A) 0.05 % cream, Apply 1 application topically 3 (three) times a week. , Disp: , Rfl:    triamterene-hydrochlorothiazide (MAXZIDE) 75-50 MG tablet, Take 0.5 tablets by mouth daily., Disp: 45 tablet, Rfl: 1  No Known Allergies  Objective:   BP 121/84    Pulse 70    Temp (!) 96.8 F (36 C) (Temporal)    Ht 5\' 9"  (1.753 m)    Wt 245 lb 6.4 oz (111.3 kg)    SpO2 99%    BMI 36.24 kg/m    Physical Exam Vitals reviewed.  Constitutional:      General: She is not in acute distress.    Appearance: Normal appearance. She is obese. She is not ill-appearing, toxic-appearing or diaphoretic.  HENT:     Head: Normocephalic and atraumatic.  Eyes:     General: No scleral icterus.       Right eye: No discharge.        Left eye: No discharge.     Conjunctiva/sclera: Conjunctivae normal.  Cardiovascular:     Rate and Rhythm: Normal rate.  Pulmonary:  Effort: Pulmonary effort is normal. No respiratory distress.  Musculoskeletal:        General: Normal range of motion.     Cervical back: Normal range of motion.  Lymphadenopathy:     Cervical: No cervical adenopathy.  Skin:    General: Skin is warm and dry.     Capillary Refill: Capillary refill takes less than 2 seconds.     Comments: Area to the left side of the neck with a small spot where skin is picked off, with surrounding pinpoint scabs. No erythema, warmth, or drainage.   Neurological:     General: No focal deficit present.     Mental Status: She is alert and oriented to person, place, and time. Mental status is at baseline.  Psychiatric:        Mood and Affect: Mood normal.        Behavior: Behavior normal.        Thought Content:  Thought content normal.        Judgment: Judgment normal.

## 2020-01-13 ENCOUNTER — Other Ambulatory Visit: Payer: Self-pay

## 2020-01-13 ENCOUNTER — Ambulatory Visit (INDEPENDENT_AMBULATORY_CARE_PROVIDER_SITE_OTHER): Payer: Medicare HMO | Admitting: Family Medicine

## 2020-01-13 ENCOUNTER — Encounter: Payer: Self-pay | Admitting: Family Medicine

## 2020-01-13 VITALS — BP 116/88 | HR 74 | Temp 97.8°F | Ht 69.0 in | Wt 242.0 lb

## 2020-01-13 DIAGNOSIS — F4310 Post-traumatic stress disorder, unspecified: Secondary | ICD-10-CM

## 2020-01-13 DIAGNOSIS — F4323 Adjustment disorder with mixed anxiety and depressed mood: Secondary | ICD-10-CM | POA: Diagnosis not present

## 2020-01-13 NOTE — Progress Notes (Signed)
Subjective: CC: f/u mood PCP: Janora Norlander, DO NWG:Lisa Boyer is a 49 y.o. female presenting to clinic today for:  1.  Anxiety, depression, PTSD Patient notes that she has had some major difficulties lately.  The most recent spell was about 2 weeks ago.  She has been trying to work with her psychologist at desensitization but unfortunately her anxiety has been too severe to continue the sessions lately.  She never started the Prozac because she was afraid to take the medicine.  She was prescribed later buspirone by a provider at the [place] but again has not yet started this due to fear of side effects.  Her boyfriend will be coming into town tomorrow and she has committed to at least taking 1 tablet starting tomorrow.  She finds herself regressing some where she is afraid to drive and get out of her home.  She is trying to stay involved at the [facility] with water aerobics as this is beneficial to her back.    ROS: Per HPI  No Known Allergies Past Medical History:  Diagnosis Date  . Allergy   . Anemia   . Anxiety   . Back pain 04/1992   chronic back pain-diagnose from [place]  . Depression   . Headache    frequent headache  . Hypertension   . PTSD (post-traumatic stress disorder)   . Thyroid disease   . Uterine fibroid     Current Outpatient Medications:  .  Brimonidine Tartrate (LUMIFY OP), Apply 1 drop to eye daily as needed (Dry eyes)., Disp: , Rfl:  .  cetirizine (ZYRTEC) 10 MG tablet, Take 10 mg by mouth at bedtime., Disp: , Rfl:  .  Cholecalciferol (VITAMIN D) 2000 UNITS tablet, Take 4,000 Units by mouth at bedtime. , Disp: , Rfl:  .  FLUoxetine (PROZAC) 20 MG capsule, Take 1 capsule (20 mg total) by mouth daily., Disp: 30 capsule, Rfl: 1 .  ketoconazole (NIZORAL) 2 % shampoo, Apply 1 application topically as needed for irritation. , Disp: , Rfl:  .  Ketotifen Fumarate (ALLERGY EYE DROPS OP), Apply 1 drop to eye daily as needed (Allergies)., Disp: , Rfl:  .  metoprolol  succinate (TOPROL-XL) 25 MG 24 hr tablet, Take 1 tablet (25 mg total) by mouth daily. (Patient taking differently: Take 12.5 mg by mouth daily. ), Disp: 90 tablet, Rfl: 3 .  Multiple Vitamins-Minerals (MULTIVITAMIN GUMMIES WOMENS PO), Take 2 each by mouth daily., Disp: , Rfl:  .  naproxen sodium (ALEVE) 220 MG tablet, Take 220 mg by mouth daily as needed (Back pain)., Disp: , Rfl:  .  tretinoin (RETIN-A) 0.05 % cream, Apply 1 application topically 3 (three) times a week. , Disp: , Rfl:  .  triamterene-hydrochlorothiazide (MAXZIDE) 75-50 MG tablet, Take 0.5 tablets by mouth daily., Disp: 45 tablet, Rfl: 1 Social History   Socioeconomic History  . Marital status: Widowed    Spouse name: Not on file  . Number of children: 1  . Years of education: Not on file  . Highest education level: Not on file  Occupational History  . Occupation: Education officer, museum  Tobacco Use  . Smoking status: Never Smoker  . Smokeless tobacco: Never Used  Vaping Use  . Vaping Use: Never used  Substance and Sexual Activity  . Alcohol use: Not Currently    Comment: social-twice a year  . Drug use: No  . Sexual activity: Not on file  Other Topics Concern  . Not on file  Social History  Narrative  . Not on file   Social Determinants of Health   Financial Resource Strain:   . Difficulty of Paying Living Expenses:   Food Insecurity:   . Worried About Charity fundraiser in the Last Year:   . Arboriculturist in the Last Year:   Transportation Needs:   . Film/video editor (Medical):   Marland Kitchen Lack of Transportation (Non-Medical):   Physical Activity:   . Days of Exercise per Week:   . Minutes of Exercise per Session:   Stress:   . Feeling of Stress :   Social Connections:   . Frequency of Communication with Friends and Family:   . Frequency of Social Gatherings with Friends and Family:   . Attends Religious Services:   . Active Member of Clubs or Organizations:   . Attends Archivist Meetings:   Marland Kitchen  Marital Status:   Intimate Partner Violence:   . Fear of Current or Ex-Partner:   . Emotionally Abused:   Marland Kitchen Physically Abused:   . Sexually Abused:    Family History  Adopted: Yes  Problem Relation Age of Onset  . Breast cancer Mother        and 2 other types but don't know what kind  . Skin cancer Mother     Objective: Office vital signs reviewed. BP 116/88   Pulse 74   Temp 97.8 F (36.6 C) (Temporal)   Ht 5\' 9"  (1.753 m)   Wt 242 lb (109.8 kg)   SpO2 98%   BMI 35.74 kg/m   Physical Examination:  General: Awake, alert, well nourished, No acute distress Cardio: regular rate and rhythm, S1S2 heard, no murmurs appreciated Pulm: clear to auscultation bilaterally, no wheezes, rhonchi or rales; normal work of breathing on room air Extremities: warm, well perfused, No edema, cyanosis or clubbing; +2 pulses bilaterally Psych: Anxious.  Emotionally labile and tearful for a bit of today's exam. Depression screen [facility] 2/9 01/13/2020 12/08/2019 07/05/2019  Decreased Interest 2 0 0  Down, Depressed, Hopeless 2 0 0  PHQ - 2 Score 4 0 0  Altered sleeping 2 - 0  Tired, decreased energy 1 - 0  Change in appetite 0 - 0  Feeling bad or failure about yourself  2 - 0  Trouble concentrating 1 - 0  Moving slowly or fidgety/restless 0 - 0  Suicidal thoughts 0 - 0  PHQ-9 Score 10 - 0  Difficult doing work/chores Very difficult - -   GAD 7 : Generalized Anxiety Score 01/13/2020 07/05/2019 04/01/2019  Nervous, Anxious, on Edge 3 0 3  Control/stop worrying 2 0 3  Worry too much - different things 2 0 3  Trouble relaxing 1 0 3  Restless 1 0 2  Easily annoyed or irritable 1 0 0  Afraid - awful might happen 2 - 1  Total GAD 7 Score 12 - 15  Anxiety Difficulty Very difficult - -    Assessment/ Plan: 49 y.o. female   1. PTSD (post-traumatic stress disorder) Continues to have quite a bit of emotional lability.  I do think at the end of the day an SSRI would be most beneficial to this patient.   However, I think buspirone is an excellent idea for anxiety.  We discussed that they could be used hand-in-hand but it sounds like she is going to need baby steps at this point.  Going to try and contact her on Monday to see how things are going.  I  have encouraged her to follow-up with me anytime.  2. Adjustment disorder with mixed anxiety and depressed mood Continue to follow-up with counseling services.  Total time spent with patient 13minutes.  Greater than 50% of encounter spent in coordination of care/counseling.  Janora Norlander, DO [place] [phone]

## 2020-03-15 ENCOUNTER — Other Ambulatory Visit: Payer: Self-pay | Admitting: *Deleted

## 2020-03-15 DIAGNOSIS — F4323 Adjustment disorder with mixed anxiety and depressed mood: Secondary | ICD-10-CM

## 2020-03-15 DIAGNOSIS — F4310 Post-traumatic stress disorder, unspecified: Secondary | ICD-10-CM

## 2020-03-15 NOTE — Telephone Encounter (Signed)
Since patient has not followed up, can we either make a f/u visit or check and make sure she is taking med?

## 2020-03-16 NOTE — Telephone Encounter (Signed)
Attempted to contact patient - NVM 

## 2020-03-21 ENCOUNTER — Telehealth: Payer: Self-pay

## 2020-03-21 NOTE — Telephone Encounter (Signed)
Attempted to contact patient- mailbox full  

## 2020-03-21 NOTE — Telephone Encounter (Signed)
Per chart patient got filled 07/05/19 #30 R-1.  Also states patient reported not taking it 01/2020- please advise

## 2020-03-21 NOTE — Telephone Encounter (Signed)
Again, please call the patient to see if she is taking.  Each of the refills were never ACTUALLY taken, she was scared to take them.  I hate for her to keep getting meds she is not even taking.

## 2020-03-21 NOTE — Telephone Encounter (Signed)
Per [facility] Rep Raquel Sarna) pt has had her Fluoxetine Rx filled in 07/05/19, 12/27/20, 01/18/20, and 02/23/20, all sent by Dr Lajuana Ripple to [facility] and pt is requesting another refill be sent to [facility].  Scheduled pt to have check up/med refill with Dr Lajuana Ripple on 05/08/20 (first available appt slot).

## 2020-03-22 ENCOUNTER — Other Ambulatory Visit: Payer: Self-pay | Admitting: *Deleted

## 2020-03-22 DIAGNOSIS — F4323 Adjustment disorder with mixed anxiety and depressed mood: Secondary | ICD-10-CM

## 2020-03-22 DIAGNOSIS — F4310 Post-traumatic stress disorder, unspecified: Secondary | ICD-10-CM

## 2020-03-22 MED ORDER — FLUOXETINE HCL 20 MG PO CAPS: 20.0000 mg | ORAL_CAPSULE | Freq: Every day | ORAL | 1 refills | Status: DC

## 2020-05-04 ENCOUNTER — Ambulatory Visit (INDEPENDENT_AMBULATORY_CARE_PROVIDER_SITE_OTHER): Payer: Medicare HMO | Admitting: Nurse Practitioner

## 2020-05-04 ENCOUNTER — Other Ambulatory Visit: Payer: Self-pay

## 2020-05-04 ENCOUNTER — Encounter: Payer: Self-pay | Admitting: Nurse Practitioner

## 2020-05-04 VITALS — BP 115/74 | HR 73 | Temp 97.7°F | Ht 69.0 in | Wt 233.2 lb

## 2020-05-04 DIAGNOSIS — T63301A Toxic effect of unspecified spider venom, accidental (unintentional), initial encounter: Secondary | ICD-10-CM | POA: Diagnosis not present

## 2020-05-04 MED ORDER — MUPIROCIN 2 % EX OINT: 1.0000 "application " | TOPICAL_OINTMENT | Freq: Two times a day (BID) | CUTANEOUS | 0 refills | Status: DC

## 2020-05-04 NOTE — Progress Notes (Signed)
Acute Office Visit  Subjective:    Patient ID: Lisa Boyer, female    DOB: 1970-10-30, 49 y.o.   MRN: [id number]  Chief Complaint  Patient presents with  . Insect Bite    HPI Patient is in today for spider bite in the last 24 hours.  Patient reports that she was trying to get toilet paper from the role in the bathroom when a brown spider sitting on the toilet paper stung her on the left little finger.  Patient is reporting no pain, no fever, no nausea vomiting, but presents with increase anxiety.  Past Medical History:  Diagnosis Date  . Allergy   . Anemia   . Anxiety   . Back pain 04/1992   chronic back pain-diagnose from [place]  . Depression   . Headache    frequent headache  . Hypertension   . PTSD (post-traumatic stress disorder)   . Thyroid disease   . Uterine fibroid     Past Surgical History:  Procedure Laterality Date  . ABDOMINAL HYSTERECTOMY  08/13/2017   left ovary removed  . BREAST BIOPSY Bilateral 1888/1191/1994/2001   remove growth from both side breast  . ENDOMETRIAL ABLATION    . THYROIDECTOMY  08/1999   partial    Family History  Adopted: Yes  Problem Relation Age of Onset  . Breast cancer Mother        and 2 other types but don't know what kind  . Skin cancer Mother     Social History   Socioeconomic History  . Marital status: Widowed    Spouse name: Not on file  . Number of children: 1  . Years of education: Not on file  . Highest education level: Not on file  Occupational History  . Occupation: Education officer, museum  Tobacco Use  . Smoking status: Never Smoker  . Smokeless tobacco: Never Used  Vaping Use  . Vaping Use: Never used  Substance and Sexual Activity  . Alcohol use: Not Currently    Comment: social-twice a year  . Drug use: No  . Sexual activity: Not on file  Other Topics Concern  . Not on file  Social History Narrative  . Not on file   Social Determinants of Health   Financial Resource Strain:   . Difficulty of Paying  Living Expenses: Not on file  Food Insecurity:   . Worried About Charity fundraiser in the Last Year: Not on file  . Ran Out of Food in the Last Year: Not on file  Transportation Needs:   . Lack of Transportation (Medical): Not on file  . Lack of Transportation (Non-Medical): Not on file  Physical Activity:   . Days of Exercise per Week: Not on file  . Minutes of Exercise per Session: Not on file  Stress:   . Feeling of Stress : Not on file  Social Connections:   . Frequency of Communication with Friends and Family: Not on file  . Frequency of Social Gatherings with Friends and Family: Not on file  . Attends Religious Services: Not on file  . Active Member of Clubs or Organizations: Not on file  . Attends Archivist Meetings: Not on file  . Marital Status: Not on file  Intimate Partner Violence:   . Fear of Current or Ex-Partner: Not on file  . Emotionally Abused: Not on file  . Physically Abused: Not on file  . Sexually Abused: Not on file    Outpatient Medications Prior to  Visit  Medication Sig Dispense Refill  . Brimonidine Tartrate (LUMIFY OP) Apply 1 drop to eye daily as needed (Dry eyes).    . cetirizine (ZYRTEC) 10 MG tablet Take 10 mg by mouth at bedtime.    . Cholecalciferol (VITAMIN D) 2000 UNITS tablet Take 4,000 Units by mouth at bedtime.     Marland Kitchen FLUoxetine (PROZAC) 20 MG capsule Take 1 capsule (20 mg total) by mouth daily. 90 capsule 1  . ketoconazole (NIZORAL) 2 % shampoo Apply 1 application topically as needed for irritation.     Marland Kitchen Ketotifen Fumarate (ALLERGY EYE DROPS OP) Apply 1 drop to eye daily as needed (Allergies).    . metoprolol succinate (TOPROL-XL) 25 MG 24 hr tablet Take 1 tablet (25 mg total) by mouth daily. (Patient taking differently: Take 12.5 mg by mouth daily. ) 90 tablet 3  . Multiple Vitamins-Minerals (MULTIVITAMIN GUMMIES WOMENS PO) Take 2 each by mouth daily.    . naproxen sodium (ALEVE) 220 MG tablet Take 220 mg by mouth daily as  needed (Back pain).    Marland Kitchen tretinoin (RETIN-A) 0.05 % cream Apply 1 application topically 3 (three) times a week.     . triamterene-hydrochlorothiazide (MAXZIDE) 75-50 MG tablet Take 0.5 tablets by mouth daily. 45 tablet 1  . busPIRone (BUSPAR) 10 MG tablet Take 10 mg by mouth as directed. Take 1/2 up to 3 x qd prn     No facility-administered medications prior to visit.    No Known Allergies  Review of Systems  Skin:       Spider bite left little finger.  All other systems reviewed and are negative.      Objective:    Physical Exam Vitals reviewed.  Constitutional:      Appearance: Normal appearance.  HENT:     Head: Normocephalic.     Mouth/Throat:     Pharynx: Oropharyngeal exudate present.  Eyes:     Conjunctiva/sclera: Conjunctivae normal.  Cardiovascular:     Rate and Rhythm: Normal rate and regular rhythm.     Pulses: Normal pulses.     Heart sounds: Normal heart sounds.  Pulmonary:     Effort: Pulmonary effort is normal.     Breath sounds: Normal breath sounds.  Abdominal:     General: Bowel sounds are normal.  Musculoskeletal:        General: Swelling present.     Comments: Left little finger  Skin:    Comments: Spider bite on left little finger  Neurological:     Mental Status: She is alert and oriented to person, place, and time.  Psychiatric:     Comments: Anxious     BP 115/74   Pulse 73   Temp 97.7 F (36.5 C) (Temporal)   Ht 5\' 9"  (1.753 m)   Wt 233 lb 4 oz (105.8 kg)   BMI 34.45 kg/m  Wt Readings from Last 3 Encounters:  05/04/20 233 lb 4 oz (105.8 kg)  01/13/20 242 lb (109.8 kg)  12/08/19 245 lb 6.4 oz (111.3 kg)    Health Maintenance Due  Topic Date Due  . Hepatitis C Screening  Never done  . HIV Screening  Never done  . TETANUS/TDAP  Never done  . PAP SMEAR-Modifier  Never done  . COLONOSCOPY  Never done  . INFLUENZA VACCINE  Never done    There are no preventive care reminders to display for this patient.   Lab Results   Component Value Date   TSH 2.01 03/26/2018  Lab Results  Component Value Date   WBC 5.9 11/18/2019   HGB 12.8 11/18/2019   HCT 36.9 11/18/2019   MCV 80.9 11/18/2019   PLT 245 11/18/2019   Lab Results  Component Value Date   NA 139 11/18/2019   K 4.3 11/18/2019   CO2 28 11/18/2019   GLUCOSE 117 (H) 11/18/2019   BUN 9 11/18/2019   CREATININE 0.75 11/18/2019   BILITOT 0.7 03/26/2018   ALKPHOS 39 05/08/2016   AST 15 03/26/2018   ALT 18 03/26/2018   PROT 6.8 03/26/2018   ALBUMIN 3.8 05/08/2016   CALCIUM 9.0 11/18/2019   ANIONGAP 8 11/18/2019   GFR 101.84 06/05/2016   Lab Results  Component Value Date   CHOL 203 (H) 05/08/2016   Lab Results  Component Value Date   HDL 82.70 05/08/2016   Lab Results  Component Value Date   LDLCALC 107 (H) 05/08/2016   Lab Results  Component Value Date   TRIG 69.0 05/08/2016   Lab Results  Component Value Date   CHOLHDL 2 05/08/2016   Lab Results  Component Value Date   HGBA1C 5.2 05/08/2016       Assessment & Plan:   Problem List Items Addressed This Visit      Other   Spider bite - Primary    Patient following up for spider bite in the last 24 hours on her left little finger.  Patient is not reporting any pain, shortness of breath, on assessment finger is not bleeding no acute signs and symptoms of inflammation.  Slight dark spot on left little finger.  Advised patient to continue to watch finger for changes.  Started patient on 2% mupirocin ointment.  Apply cool compress, antihistamine if itching, follow-up with worsening or unresolved symptoms.  Patient verbalized understanding  Rx sent to pharmacy.          Meds ordered this encounter  Medications  . mupirocin ointment (BACTROBAN) 2 %    Sig: Apply 1 application topically 2 (two) times daily.    Dispense:  22 g    Refill:  0    Order Specific Question:   Supervising Provider    Answer:   Caryl Pina A [[id number]]     Ivy Lynn, NP

## 2020-05-04 NOTE — Patient Instructions (Signed)
Spider Bite Spider bites are not common. Most spider bites do not cause serious problems. There are only a few types of spider bites that can cause serious health problems. What are the causes? This condition is caused when you make contact with a spider in a way that traps the spider against your skin. What are the signs or symptoms? Some spider bites may cause symptoms within 1 hour after the bite. For other spider bites, it may take 1-2 days for symptoms to appear. Symptoms include:  A raised area that is red.  Redness and swelling around the area of the bite.  Pain in the area of the bite. A few types of spiders, such as the black widow spider or the brown recluse spider, can inject poison (venom) into a bite wound. This causes more serious symptoms. Symptoms of these bites vary, and may include:  Muscle cramps.  Feeling sick to your stomach (nauseous).  Throwing up (vomiting).  Pain in your belly (abdomen).  A fever.  A skin sore (lesion) that spreads. This can break into an open wound (skin ulcer).  Feeling light-headed or dizzy. How is this treated? Many spider bites do not need treatment. If needed, treatment may include:  Icing and keeping the bite area raised (elevated).  Taking or applying over-the-counter or prescription medicines to help with symptoms such as pain and itching.  Having a tetanus shot.  Taking antibiotic medicine. Follow these instructions at home: Medicines  Take or apply over-the-counter and prescription medicines only as told by your doctor.  If you were prescribed an antibiotic medicine, take it as told by your doctor. Do not stop using it even if you start to feel better. Managing pain and swelling   If told, put ice on the bite area. ? Put ice in a plastic bag. ? Place a towel between your skin and the bag. ? Leave the ice on for 20 minutes, 2-3 times a day.  Raise the bite area above the level of your heart while you are sitting or  lying down. General instructions   Do not scratch the bite area.  Keep the bite area clean and dry. Wash the bite area with soap and water each day as told by your doctor.  Keep all follow-up visits as told by your doctor. This is important. Contact a doctor if:  Your bite does not get better after 3 days.  Your bite turns black or purple.  Near the bite, you have more: ? Redness. ? Swelling. ? Pain. Get help right away if:  You get shortness of breath or chest pain.  You have fluid, blood, or pus coming from the bite area.  You have painful muscle cramps or sudden muscle tightening (spasms).  You have belly pain.  You feel sick to your stomach or you throw up.  You feel more tired or sleepy than normal. Summary  Spider bites are not common. When spider bites do happen, most do not cause serious health problems.  Take or apply all medicines only as told by your doctor.  Keep the bite area clean and dry. Wash the bite area with soap and water each day as told by your doctor.  Contact a doctor if you have more redness, swelling, or pain near the bite.  Get help right away if you get shortness of breath or chest pain. This information is not intended to replace advice given to you by your health care provider. Make sure you discuss any  questions you have with your health care provider. Document Revised: 12/29/2017 Document Reviewed: 12/29/2017 Elsevier Patient Education  [place].

## 2020-05-05 DIAGNOSIS — T63301A Toxic effect of unspecified spider venom, accidental (unintentional), initial encounter: Secondary | ICD-10-CM | POA: Insufficient documentation

## 2020-05-05 NOTE — Assessment & Plan Note (Addendum)
Patient following up for spider bite in the last 24 hours on her left little finger.  Patient is not reporting any pain, shortness of breath, on assessment finger is not bleeding no acute signs and symptoms of inflammation.  Slight dark spot on left little finger.  Advised patient to continue to watch finger for changes.  Started patient on 2% mupirocin ointment.  Apply cool compress, antihistamine if itching, follow-up with worsening or unresolved symptoms.  Patient verbalized understanding  Rx sent to pharmacy.

## 2020-05-08 ENCOUNTER — Ambulatory Visit: Payer: Medicare HMO | Admitting: Family Medicine

## 2020-06-12 ENCOUNTER — Ambulatory Visit (INDEPENDENT_AMBULATORY_CARE_PROVIDER_SITE_OTHER): Payer: Medicare HMO | Admitting: Family Medicine

## 2020-06-12 ENCOUNTER — Other Ambulatory Visit: Payer: Self-pay

## 2020-06-12 VITALS — BP 111/71 | HR 70 | Temp 97.2°F | Ht 69.0 in | Wt 232.0 lb

## 2020-06-12 DIAGNOSIS — W19XXXS Unspecified fall, sequela: Secondary | ICD-10-CM | POA: Diagnosis not present

## 2020-06-12 DIAGNOSIS — F4323 Adjustment disorder with mixed anxiety and depressed mood: Secondary | ICD-10-CM

## 2020-06-12 DIAGNOSIS — F4310 Post-traumatic stress disorder, unspecified: Secondary | ICD-10-CM | POA: Diagnosis not present

## 2020-06-12 NOTE — Progress Notes (Signed)
Subjective: CC: Follow-up anxiety, PTSD, panic PCP: Janora Norlander, DO [id number] [place] is a 50 y.o. female presenting to clinic today for:  1.  Anxiety, PTSD, panic Patient reports that she is actually been doing extremely well since her last visit.  She did in fact finally start the fluoxetine 20 mg daily and has noticed a difference in her reactivity.  She now takes time to calm down and think things through before she reacts.  She is still romantically involved with her boyfriend, Rob who resides in [place].  He works for the [employer] as a Community education officer.  She has been commuting on her own all the way to [facility] but does often have to stop there and have him drive her the rest of the way because she is not totally comfortable yet driving on busy roads.  She is seeing a psychiatrist at the [place] and they are very excited for her journey now that she is taking medication and improving.  There was a discussion of potentially increasing her dose to 40 mg but she is reluctant about this.  They're going to see how things go over the next couple of months before pursuing that.   She did have an episode where she fell down and hit her head in front of an [employer] parking lot when she stepped off the curb.  She did not lose consciousness as far she knows but is continuing to heal from that fall.  She had injured her left hand but that the swelling is going down as well and she is got full use of it.   ROS: Per HPI  No Known Allergies Past Medical History:  Diagnosis Date  . Allergy   . Anemia   . Anxiety   . Back pain 04/1992   chronic back pain-diagnose from [place]  . Depression   . Headache    frequent headache  . Hypertension   . PTSD (post-traumatic stress disorder)   . Thyroid disease   . Uterine fibroid     Current Outpatient Medications:  .  Brimonidine Tartrate (LUMIFY OP), Apply 1 drop to eye daily as needed (Dry eyes)., Disp: , Rfl:  .  cetirizine (ZYRTEC)  10 MG tablet, Take 10 mg by mouth at bedtime., Disp: , Rfl:  .  Cholecalciferol (VITAMIN D) 2000 UNITS tablet, Take 4,000 Units by mouth at bedtime. , Disp: , Rfl:  .  FLUoxetine (PROZAC) 20 MG capsule, Take 1 capsule (20 mg total) by mouth daily., Disp: 90 capsule, Rfl: 1 .  ketoconazole (NIZORAL) 2 % shampoo, Apply 1 application topically as needed for irritation. , Disp: , Rfl:  .  Ketotifen Fumarate (ALLERGY EYE DROPS OP), Apply 1 drop to eye daily as needed (Allergies)., Disp: , Rfl:  .  metoprolol succinate (TOPROL-XL) 25 MG 24 hr tablet, Take 1 tablet (25 mg total) by mouth daily. (Patient taking differently: Take 12.5 mg by mouth daily. ), Disp: 90 tablet, Rfl: 3 .  Multiple Vitamins-Minerals (MULTIVITAMIN GUMMIES WOMENS PO), Take 2 each by mouth daily., Disp: , Rfl:  .  mupirocin ointment (BACTROBAN) 2 %, Apply 1 application topically 2 (two) times daily., Disp: 22 g, Rfl: 0 .  naproxen sodium (ALEVE) 220 MG tablet, Take 220 mg by mouth daily as needed (Back pain)., Disp: , Rfl:  .  tretinoin (RETIN-A) 0.05 % cream, Apply 1 application topically 3 (three) times a week. , Disp: , Rfl:  .  triamterene-hydrochlorothiazide (MAXZIDE) 75-50 MG  tablet, Take 0.5 tablets by mouth daily., Disp: 45 tablet, Rfl: 1 Social History   Socioeconomic History  . Marital status: Widowed    Spouse name: Not on file  . Number of children: 1  . Years of education: Not on file  . Highest education level: Not on file  Occupational History  . Occupation: Education officer, museum  Tobacco Use  . Smoking status: Never Smoker  . Smokeless tobacco: Never Used  Vaping Use  . Vaping Use: Never used  Substance and Sexual Activity  . Alcohol use: Not Currently    Comment: social-twice a year  . Drug use: No  . Sexual activity: Not on file  Other Topics Concern  . Not on file  Social History Narrative  . Not on file   Social Determinants of Health   Financial Resource Strain: Not on file  Food Insecurity: Not on  file  Transportation Needs: Not on file  Physical Activity: Not on file  Stress: Not on file  Social Connections: Not on file  Intimate Partner Violence: Not on file   Family History  Adopted: Yes  Problem Relation Age of Onset  . Breast cancer Mother        and 2 other types but don't know what kind  . Skin cancer Mother     Objective: Office vital signs reviewed. BP 111/71   Pulse 70   Temp (!) 97.2 F (36.2 C) (Temporal)   Ht 5\' 9"  (1.753 m)   Wt 232 lb (105.2 kg)   SpO2 99%   BMI 34.26 kg/m   Physical Examination:  General: Awake, alert, well nourished, No acute distress Head: Ecchymosis improving along the orbital ridge on the left Left hand: Soft tissue swelling present but mild.  She has full active range of motion. Psych: Appears upbeat.  Mood is happy.  She is pleasant, interactive. Depression screen [facility] 2/9 06/12/2020 01/13/2020 12/08/2019  Decreased Interest 0 2 0  Down, Depressed, Hopeless 1 2 0  PHQ - 2 Score 1 4 0  Altered sleeping 2 2 -  Tired, decreased energy 1 1 -  Change in appetite 0 0 -  Feeling bad or failure about yourself  1 2 -  Trouble concentrating 1 1 -  Moving slowly or fidgety/restless 0 0 -  Suicidal thoughts 0 0 -  PHQ-9 Score 6 10 -  Difficult doing work/chores Not difficult at all Very difficult -   GAD 7 : Generalized Anxiety Score 06/12/2020 01/13/2020 07/05/2019 04/01/2019  Nervous, Anxious, on Edge 2 3 0 3  Control/stop worrying 1 2 0 3  Worry too much - different things 1 2 0 3  Trouble relaxing 2 1 0 3  Restless 2 1 0 2  Easily annoyed or irritable 0 1 0 0  Afraid - awful might happen 2 2 - 1  Total GAD 7 Score 10 12 - 15  Anxiety Difficulty - Very difficult - -     Assessment/ Plan: 50 y.o. female   PTSD (post-traumatic stress disorder)  Adjustment disorder with mixed anxiety and depressed mood  Fall, sequela  I think that she is doing extremely well.  She has really jumped some big hurdles since our last visit.  She  is compliant with her Prozac and actually sees that she is doing well on this.  Prognosis is very optimistic.  Continue following up with therapy and psychiatry.  I asked her to keep an open mind about increasing her dose if her psychiatrist  feels that it is necessary and it very well may be.  She may follow-up with me for her next physical exam at her earliest convenience.  I congratulated her on her progress  With regards to her fall her left hand did have mild soft tissue swelling still but she seemed to have full active range of motion and no difficulty with utilizing that left hand.  No orders of the defined types were placed in this encounter.  No orders of the defined types were placed in this encounter.    Janora Norlander, DO [place] [phone]

## 2020-08-01 ENCOUNTER — Encounter: Payer: Self-pay | Admitting: Family Medicine

## 2020-08-01 ENCOUNTER — Other Ambulatory Visit: Payer: Self-pay

## 2020-08-01 ENCOUNTER — Ambulatory Visit (INDEPENDENT_AMBULATORY_CARE_PROVIDER_SITE_OTHER): Payer: Medicare HMO | Admitting: Family Medicine

## 2020-08-01 VITALS — BP 122/82 | HR 65 | Temp 96.7°F | Ht 69.0 in | Wt 234.5 lb

## 2020-08-01 DIAGNOSIS — N939 Abnormal uterine and vaginal bleeding, unspecified: Secondary | ICD-10-CM | POA: Diagnosis not present

## 2020-08-01 DIAGNOSIS — Z90710 Acquired absence of both cervix and uterus: Secondary | ICD-10-CM | POA: Diagnosis not present

## 2020-08-01 LAB — URINALYSIS, COMPLETE
Bilirubin, UA: NEGATIVE
Glucose, UA: NEGATIVE
Ketones, UA: NEGATIVE
Leukocytes,UA: NEGATIVE
Nitrite, UA: NEGATIVE
Protein,UA: NEGATIVE
Specific Gravity, UA: 1.02 (ref 1.005–1.030)
Urobilinogen, Ur: 0.2 mg/dL (ref 0.2–1.0)
pH, UA: 6.5 (ref 5.0–7.5)

## 2020-08-01 LAB — MICROSCOPIC EXAMINATION
Bacteria, UA: NONE SEEN
Epithelial Cells (non renal): NONE SEEN /hpf (ref 0–10)
WBC, UA: NONE SEEN /hpf (ref 0–5)

## 2020-08-01 NOTE — Patient Instructions (Signed)
Postmenopausal Bleeding Postmenopausal bleeding is any bleeding that a woman has after she has entered menopause. Menopause is the end of a woman's fertile years. After menopause, a woman no longer ovulates and does not have menstrual periods. Therefore, she should no longer have bleeding from her vagina. Postmenopausal bleeding may have various causes, including:  Menopausal hormone therapy (MHT).  Endometrial atrophy. After menopause, low estrogen hormone levels cause the membrane that lines the uterus (endometrium) to become thin. You may have bleeding as the endometrium thins.  Endometrial hyperplasia. This condition is caused by excess estrogen hormones and low levels of progesterone hormones. The excess estrogen causes the endometrium to thicken, which can lead to bleeding. In some cases, this can lead to cancer of the uterus.  Endometrial cancer.  Noncancerous growths (polyps) on the endometrium, the lining of the uterus, or the cervix.  Uterine fibroids. These are noncancerous growths in or around the uterus muscle tissue that can cause heavy bleeding. Any type of postmenopausal bleeding, even if it appears to be a typical menstrual period, should be checked by your health care provider. Treatment will depend on the cause of the bleeding. Follow these instructions at home:  Pay attention to any changes in your symptoms. Let your health care provider know about them.  Avoid using tampons and douches as told by your health care provider.  Change your pads regularly.  Get regular pelvic exams, including Pap tests, as told by your health care provider.  Take iron supplements as told by your health care provider.  Take over-the-counter and prescription medicines only as told by your health care provider.  Keep all follow-up visits. This is important.   Contact a health care provider if:  You have new bleeding from the vagina after menopause.  You have pain in your abdomen. Get  help right away if:  You have a fever or chills.  You have severe pain with bleeding.  You are passing blood clots.  You have heavy bleeding, need more than 1 pad an hour, and have never experienced this before.  You have headaches or feel faint or dizzy. Summary  Postmenopausal bleeding is any bleeding that a woman has after she has entered into menopause.  Postmenopausal bleeding may have various causes. Treatment will depend on the cause of the bleeding.  Any type of postmenopausal bleeding, even if it appears to be a typical menstrual period, should be checked by your health care provider.  Be sure to pay attention to any changes in your symptoms and keep all follow-up visits. This information is not intended to replace advice given to you by your health care provider. Make sure you discuss any questions you have with your health care provider. Document Revised: 11/03/2019 Document Reviewed: 11/03/2019 Elsevier Patient Education  2021 Elsevier Inc.  

## 2020-08-01 NOTE — Progress Notes (Signed)
Acute Office Visit  Subjective:    Patient ID: Lisa Boyer, female    DOB: [birth year], 50 y.o.   MRN: [id number]  Chief Complaint  Patient presents with  . Vaginal Bleeding    HPI Patient is in today for vaginal bleeding x 2 days. She reports a small amount of bright red bleeding yesterday followed by pink spotting that has continued through today.  Denies vaginal pain, itching, dysuria, fever, flank pain, urinary frequency, urinary urgency, abdominal pain, or changes in bowel habits. She has had a hysterectomy with removal of her cervix in the past. She has one ovary left. Reports last sexual intercourse was about 6 weeks ago. She did have some vaginal irritation following last sexual intercouse and used monistat with improvement. She does douche sometimes.   Past Medical History:  Diagnosis Date  . Allergy   . Anemia   . Anxiety   . Back pain 04/1992   chronic back pain-diagnose from [place]  . Depression   . Headache    frequent headache  . Hypertension   . PTSD (post-traumatic stress disorder)   . Thyroid disease   . Uterine fibroid     Past Surgical History:  Procedure Laterality Date  . ABDOMINAL HYSTERECTOMY  08/13/2017   left ovary removed  . BREAST BIOPSY Bilateral 1888/1191/1994/2001   remove growth from both side breast  . ENDOMETRIAL ABLATION    . THYROIDECTOMY  08/1999   partial    Family History  Adopted: Yes  Problem Relation Age of Onset  . Breast cancer Mother        and 2 other types but don't know what kind  . Skin cancer Mother     Social History   Socioeconomic History  . Marital status: Widowed    Spouse name: Not on file  . Number of children: 1  . Years of education: Not on file  . Highest education level: Not on file  Occupational History  . Occupation: Education officer, museum  Tobacco Use  . Smoking status: Never Smoker  . Smokeless tobacco: Never Used  Vaping Use  . Vaping Use: Never used  Substance and Sexual Activity  . Alcohol use:  Not Currently    Comment: social-twice a year  . Drug use: No  . Sexual activity: Not on file  Other Topics Concern  . Not on file  Social History Narrative  . Not on file   Social Determinants of Health   Financial Resource Strain: Not on file  Food Insecurity: Not on file  Transportation Needs: Not on file  Physical Activity: Not on file  Stress: Not on file  Social Connections: Not on file  Intimate Partner Violence: Not on file    Outpatient Medications Prior to Visit  Medication Sig Dispense Refill  . Brimonidine Tartrate (LUMIFY OP) Apply 1 drop to eye daily as needed (Dry eyes).    . cetirizine (ZYRTEC) 10 MG tablet Take 10 mg by mouth at bedtime.    . Cholecalciferol (VITAMIN D) 2000 UNITS tablet Take 4,000 Units by mouth at bedtime.     Marland Kitchen FLUoxetine (PROZAC) 20 MG capsule Take 1 capsule (20 mg total) by mouth daily. (Patient taking differently: Take 40 mg by mouth daily.) 90 capsule 1  . Ketotifen Fumarate (ALLERGY EYE DROPS OP) Apply 1 drop to eye daily as needed (Allergies).    . metoprolol succinate (TOPROL-XL) 25 MG 24 hr tablet Take 1 tablet (25 mg total) by mouth daily. (Patient taking differently: Take  12.5 mg by mouth daily.) 90 tablet 3  . naproxen sodium (ALEVE) 220 MG tablet Take 220 mg by mouth daily as needed (Back pain).    Marland Kitchen tretinoin (RETIN-A) 0.05 % cream Apply 1 application topically 3 (three) times a week.     . triamterene-hydrochlorothiazide (MAXZIDE) 75-50 MG tablet Take 0.5 tablets by mouth daily. 45 tablet 1  . ketoconazole (NIZORAL) 2 % shampoo Apply 1 application topically as needed for irritation.  (Patient not taking: Reported on 08/01/2020)    . mupirocin ointment (BACTROBAN) 2 % Apply 1 application topically 2 (two) times daily. 22 g 0   No facility-administered medications prior to visit.    No Known Allergies  Review of Systems As per HPI.     Objective:    Physical Exam Vitals and nursing note reviewed.  Constitutional:       General: She is not in acute distress.    Appearance: Normal appearance. She is not ill-appearing, toxic-appearing or diaphoretic.  Cardiovascular:     Rate and Rhythm: Normal rate and regular rhythm.     Heart sounds: Normal heart sounds. No murmur heard.   Pulmonary:     Effort: Pulmonary effort is normal. No respiratory distress.     Breath sounds: Normal breath sounds.  Abdominal:     General: Bowel sounds are normal. There is no distension.     Palpations: There is no mass.     Tenderness: There is no abdominal tenderness. There is no right CVA tenderness, left CVA tenderness, guarding or rebound.  Genitourinary:    General: Normal vulva.     Exam position: Lithotomy position.     Vagina: Signs of injury (small laceration with small amount of frank blood located anteriorly, no active bleeding) present. No foreign body. Vaginal discharge (brown) present. No erythema, tenderness or lesions.  Musculoskeletal:     Right lower leg: No edema.     Left lower leg: No edema.  Skin:    General: Skin is warm and dry.  Neurological:     General: No focal deficit present.     Mental Status: She is alert and oriented to person, place, and time.  Psychiatric:        Mood and Affect: Mood normal.        Behavior: Behavior normal.    Urine dipstick shows positive for RBC's.  Micro exam: 0 WBC's per HPF, 0-2 RBC's per HPF and no bacteria.   BP 122/82   Pulse 65   Temp (!) 96.7 F (35.9 C) (Temporal)   Ht 5\' 9"  (1.753 m)   Wt 234 lb 8 oz (106.4 kg)   BMI 34.63 kg/m  Wt Readings from Last 3 Encounters:  08/01/20 234 lb 8 oz (106.4 kg)  06/12/20 232 lb (105.2 kg)  05/04/20 233 lb 4 oz (105.8 kg)    Health Maintenance Due  Topic Date Due  . Hepatitis C Screening  Never done  . HIV Screening  Never done  . TETANUS/TDAP  Never done  . PAP SMEAR-Modifier  Never done  . COLONOSCOPY (Pts 45-5yrs Insurance coverage will need to be confirmed)  Never done  . COVID-19 Vaccine (3 -  Booster for Pfizer series) 02/20/2020    There are no preventive care reminders to display for this patient.   Lab Results  Component Value Date   TSH 2.01 03/26/2018   Lab Results  Component Value Date   WBC 5.9 11/18/2019   HGB 12.8 11/18/2019   HCT  36.9 11/18/2019   MCV 80.9 11/18/2019   PLT 245 11/18/2019   Lab Results  Component Value Date   NA 139 11/18/2019   K 4.3 11/18/2019   CO2 28 11/18/2019   GLUCOSE 117 (H) 11/18/2019   BUN 9 11/18/2019   CREATININE 0.75 11/18/2019   BILITOT 0.7 03/26/2018   ALKPHOS 39 05/08/2016   AST 15 03/26/2018   ALT 18 03/26/2018   PROT 6.8 03/26/2018   ALBUMIN 3.8 05/08/2016   CALCIUM 9.0 11/18/2019   ANIONGAP 8 11/18/2019   GFR 101.84 06/05/2016   Lab Results  Component Value Date   CHOL 203 (H) 05/08/2016   Lab Results  Component Value Date   HDL 82.70 05/08/2016   Lab Results  Component Value Date   LDLCALC 107 (H) 05/08/2016   Lab Results  Component Value Date   TRIG 69.0 05/08/2016   Lab Results  Component Value Date   CHOLHDL 2 05/08/2016   Lab Results  Component Value Date   HGBA1C 5.2 05/08/2016       Assessment & Plan:   Ashby was seen today for vaginal bleeding.  Diagnoses and all orders for this visit:  Abnormal vaginal bleeding/Hx of hysterectomy Small vaginal laceration and brown discharge on pelvic exam today, no active bleeding. UA does not indicate UTI. Urine cytology is pending. If cytology is negative and vaginal bleeding continues, discussed referral to GYN for further assessment.  -     Urinalysis, Complete -     Urine cytology ancillary only   No orders of the defined types were placed in this encounter.  The patient indicates understanding of these issues and agrees with the plan.  Gwenlyn Perking, FNP

## 2020-10-10 ENCOUNTER — Other Ambulatory Visit: Payer: Self-pay

## 2020-10-10 ENCOUNTER — Encounter: Payer: Self-pay | Admitting: Family Medicine

## 2020-10-10 ENCOUNTER — Ambulatory Visit (INDEPENDENT_AMBULATORY_CARE_PROVIDER_SITE_OTHER): Payer: Medicare HMO | Admitting: Family Medicine

## 2020-10-10 VITALS — BP 137/80 | HR 80 | Temp 97.6°F | Ht 69.0 in | Wt 227.0 lb

## 2020-10-10 DIAGNOSIS — F4323 Adjustment disorder with mixed anxiety and depressed mood: Secondary | ICD-10-CM | POA: Diagnosis not present

## 2020-10-10 DIAGNOSIS — I10 Essential (primary) hypertension: Secondary | ICD-10-CM | POA: Diagnosis not present

## 2020-10-10 DIAGNOSIS — H938X3 Other specified disorders of ear, bilateral: Secondary | ICD-10-CM | POA: Diagnosis not present

## 2020-10-10 DIAGNOSIS — F4310 Post-traumatic stress disorder, unspecified: Secondary | ICD-10-CM | POA: Diagnosis not present

## 2020-10-10 NOTE — Progress Notes (Signed)
Subjective: CC: Follow-up anxiety, PTSD PCP: Janora Norlander, DO [id number] Lisa Boyer is a 51 y.o. female presenting to clinic today for:  1.  Anxiety, PTSD/ HTN Patient reports overall she is doing relatively well.  She of course still has some breakthrough anxiety is in fact working with a therapist in addition to her provider at the [place] for this.  She is currently in couples counseling with her boyfriend.  They are trying to work through some of the stressors and arguments that they have been experiencing as of late.  She really wants to make that relationship work and does admit that some of her anxiety does play a part.  She has been totally compliant with her fluoxetine and is very proud of this as this seems to be helping her symptoms quite a bit.  She had advance to 40 mg at one point but her blood pressure was elevated and they were worried about this being the causation.  She has de-escalated since that time is taking 20 mg daily.  Blood pressure has normalized thankfully but she also has been trying to follow a keto diet in efforts to lose weight.  This far she is had some success.  2. Ear fullness Patient reports sensation of ear fullness.  She hears better if she pulls on her ear.  Seeing an ear nose and throat doctor soon.  She thinks she might have excessive wax.  She does report occasional tinnitus but has not had any dizzy spells  ROS: Per HPI  No Known Allergies Past Medical History:  Diagnosis Date  . Allergy   . Anemia   . Anxiety   . Back pain 04/1992   chronic back pain-diagnose from [place]  . Depression   . Headache    frequent headache  . Hypertension   . PTSD (post-traumatic stress disorder)   . Thyroid disease   . Uterine fibroid     Current Outpatient Medications:  .  Brimonidine Tartrate (LUMIFY OP), Apply 1 drop to eye daily as needed (Dry eyes)., Disp: , Rfl:  .  cetirizine (ZYRTEC) 10 MG tablet, Take 10 mg by mouth at bedtime., Disp: , Rfl:  .   Cholecalciferol (VITAMIN D) 2000 UNITS tablet, Take 4,000 Units by mouth at bedtime. , Disp: , Rfl:  .  FLUoxetine (PROZAC) 20 MG capsule, Take 1 capsule (20 mg total) by mouth daily. (Patient taking differently: Take 40 mg by mouth daily.), Disp: 90 capsule, Rfl: 1 .  ketoconazole (NIZORAL) 2 % shampoo, Apply 1 application topically as needed for irritation., Disp: , Rfl:  .  Ketotifen Fumarate (ALLERGY EYE DROPS OP), Apply 1 drop to eye daily as needed (Allergies)., Disp: , Rfl:  .  metoprolol succinate (TOPROL-XL) 25 MG 24 hr tablet, Take 1 tablet (25 mg total) by mouth daily. (Patient taking differently: Take 12.5 mg by mouth daily.), Disp: 90 tablet, Rfl: 3 .  naproxen sodium (ALEVE) 220 MG tablet, Take 220 mg by mouth daily as needed (Back pain)., Disp: , Rfl:  .  tretinoin (RETIN-A) 0.05 % cream, Apply 1 application topically 3 (three) times a week. , Disp: , Rfl:  .  triamterene-hydrochlorothiazide (MAXZIDE) 75-50 MG tablet, Take 0.5 tablets by mouth daily., Disp: 45 tablet, Rfl: 1 Social History   Socioeconomic History  . Marital status: Widowed    Spouse name: Not on file  . Number of children: 1  . Years of education: Not on file  . Highest education level: Not on file  Occupational History  . Occupation: Education officer, museum  Tobacco Use  . Smoking status: Never Smoker  . Smokeless tobacco: Never Used  Vaping Use  . Vaping Use: Never used  Substance and Sexual Activity  . Alcohol use: Not Currently    Comment: social-twice a year  . Drug use: No  . Sexual activity: Not on file  Other Topics Concern  . Not on file  Social History Narrative  . Not on file   Social Determinants of Health   Financial Resource Strain: Not on file  Food Insecurity: Not on file  Transportation Needs: Not on file  Physical Activity: Not on file  Stress: Not on file  Social Connections: Not on file  Intimate Partner Violence: Not on file   Family History  Adopted: Yes  Problem Relation Age of  Onset  . Breast cancer Mother        and 2 other types but don't know what kind  . Skin cancer Mother     Objective: Office vital signs reviewed. BP 137/80   Pulse 80   Temp 97.6 F (36.4 C)   Ht 5\' 9"  (1.753 m)   Wt 227 lb (103 kg)   LMP 06/22/2016   SpO2 99%   BMI 33.52 kg/m   Physical Examination:  General: Awake, alert, well nourished, No acute distress HEENT: Normal; TMs intact bilaterally.  No appreciable cerumen external auditory canal.  Sclera white.  Moist mucous membranes Cardio: regular rate and rhythm, S1S2 heard, no murmurs appreciated Pulm: clear to auscultation bilaterally, no wheezes, rhonchi or rales; normal work of breathing on room air Psych: Mood stable, speech normal, affect appropriate.  Patient is pleasant and interactive.  Thought process is linear.  Depression screen [facility] 2/9 10/10/2020 06/12/2020 01/13/2020  Decreased Interest 0 0 2  Down, Depressed, Hopeless 1 1 2   PHQ - 2 Score 1 1 4   Altered sleeping 2 2 2   Tired, decreased energy 1 1 1   Change in appetite 0 0 0  Feeling bad or failure about yourself  1 1 2   Trouble concentrating 1 1 1   Moving slowly or fidgety/restless 0 0 0  Suicidal thoughts 0 0 0  PHQ-9 Score 6 6 10   Difficult doing work/chores Not difficult at all Not difficult at all Very difficult   GAD 7 : Generalized Anxiety Score 10/10/2020 06/12/2020 01/13/2020 07/05/2019  Nervous, Anxious, on Edge 2 2 3  0  Control/stop worrying 1 1 2  0  Worry too much - different things 1 1 2  0  Trouble relaxing 2 2 1  0  Restless 2 2 1  0  Easily annoyed or irritable 0 0 1 0  Afraid - awful might happen 2 2 2  -  Total GAD 7 Score 10 10 12  -  Anxiety Difficulty Somewhat difficult - Very difficult -    Assessment/ Plan: 50 y.o. female   PTSD (post-traumatic stress disorder)  Adjustment disorder with mixed anxiety and depressed mood  Ear fullness, bilateral  HTN (hypertension), benign  Her anxiety seems to be doing fairly well since her last  visit at least subjectively.  Her scores are fairly unchanged from previous checkup but I think that from a subjective standpoint she seems to be doing really well.  She is to continue following up with specialist as directed and I would like to see her back in 6 months for interval checkup and annual physical exam  Nothing on exam to explain sensation of ear fullness.  She is continue her  antihistamine if needed throughout allergy season.  Keep appointment with ENT given reports of tinnitus.  Blood pressures controlled.  No changes needed today.  Not sure that Prozac is really something that typically causes elevation of blood pressure.  Would certainly rechallenge her with higher dose at a future time if this is needed  No orders of the defined types were placed in this encounter.  No orders of the defined types were placed in this encounter.    Janora Norlander, DO [place] [phone]

## 2021-02-01 ENCOUNTER — Ambulatory Visit (INDEPENDENT_AMBULATORY_CARE_PROVIDER_SITE_OTHER): Payer: Medicare HMO | Admitting: Family Medicine

## 2021-02-01 ENCOUNTER — Encounter: Payer: Self-pay | Admitting: Family Medicine

## 2021-02-01 DIAGNOSIS — W503XXA Accidental bite by another person, initial encounter: Secondary | ICD-10-CM | POA: Diagnosis not present

## 2021-02-01 DIAGNOSIS — L03113 Cellulitis of right upper limb: Secondary | ICD-10-CM | POA: Diagnosis not present

## 2021-02-01 MED ORDER — FLUCONAZOLE 150 MG PO TABS: 150.0000 mg | ORAL_TABLET | Freq: Once | ORAL | 0 refills | Status: AC

## 2021-02-01 MED ORDER — AMOXICILLIN-POT CLAVULANATE 875-125 MG PO TABS: 1.0000 | ORAL_TABLET | Freq: Two times a day (BID) | ORAL | 0 refills | Status: DC
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
## 2021-03-28 ENCOUNTER — Other Ambulatory Visit: Payer: Self-pay | Admitting: Family Medicine

## 2021-03-28 DIAGNOSIS — F4323 Adjustment disorder with mixed anxiety and depressed mood: Secondary | ICD-10-CM

## 2021-03-28 DIAGNOSIS — F4310 Post-traumatic stress disorder, unspecified: Secondary | ICD-10-CM

## 2021-04-15 ENCOUNTER — Encounter: Payer: Self-pay | Admitting: Family Medicine

## 2021-04-15 ENCOUNTER — Ambulatory Visit (INDEPENDENT_AMBULATORY_CARE_PROVIDER_SITE_OTHER): Payer: Medicare HMO | Admitting: Family Medicine

## 2021-04-15 ENCOUNTER — Other Ambulatory Visit: Payer: Self-pay

## 2021-04-15 VITALS — BP 133/85 | HR 64 | Temp 98.3°F | Ht 69.0 in | Wt 236.8 lb

## 2021-04-15 DIAGNOSIS — R739 Hyperglycemia, unspecified: Secondary | ICD-10-CM | POA: Diagnosis not present

## 2021-04-15 DIAGNOSIS — Z130 Encounter for screening for diseases of the blood and blood-forming organs and certain disorders involving the immune mechanism: Secondary | ICD-10-CM | POA: Diagnosis not present

## 2021-04-15 DIAGNOSIS — Z0001 Encounter for general adult medical examination with abnormal findings: Secondary | ICD-10-CM | POA: Diagnosis not present

## 2021-04-15 DIAGNOSIS — Z0000 Encounter for general adult medical examination without abnormal findings: Secondary | ICD-10-CM

## 2021-04-15 DIAGNOSIS — Z90710 Acquired absence of both cervix and uterus: Secondary | ICD-10-CM

## 2021-04-15 DIAGNOSIS — F4323 Adjustment disorder with mixed anxiety and depressed mood: Secondary | ICD-10-CM

## 2021-04-15 DIAGNOSIS — F4310 Post-traumatic stress disorder, unspecified: Secondary | ICD-10-CM

## 2021-04-15 DIAGNOSIS — I10 Essential (primary) hypertension: Secondary | ICD-10-CM | POA: Diagnosis not present

## 2021-04-15 DIAGNOSIS — E890 Postprocedural hypothyroidism: Secondary | ICD-10-CM

## 2021-04-15 DIAGNOSIS — Z23 Encounter for immunization: Secondary | ICD-10-CM

## 2021-04-15 LAB — BAYER DCA HB A1C WAIVED: HB A1C (BAYER DCA - WAIVED): 5.7 % — ABNORMAL HIGH (ref 4.8–5.6)

## 2021-04-15 NOTE — Progress Notes (Signed)
Lisa Boyer is a 50 y.o. female presents to office today for annual physical exam examination.    Concerns today include: 1. none  Marital status: single, Substance use: none Diet: no restriction (desires weight loss. Pursuing keto first but open to meds), Exercise: variable Last eye exam: UTD Last dental exam: UTD Last colonoscopy: UTD Last mammogram: UTD Last pap smear: needs, h/o HPV and partial hysterectomy w/ sparing of cervical cuff Refills needed today:  Immunizations needed: Immunization History  Administered Date(s) Administered   Influenza-Unspecified 06/10/2020   PFIZER(Purple Top)SARS-COV-2 Vaccination 07/30/2019, 08/20/2019     Past Medical History:  Diagnosis Date   Allergy    Anemia    Anxiety    Back pain 04/1992   chronic back pain-diagnose from [place]   Depression    Headache    frequent headache   Hypertension    PTSD (post-traumatic stress disorder)    Thyroid disease    Uterine fibroid    Social History   Socioeconomic History   Marital status: Widowed    Spouse name: Not on file   Number of children: 1   Years of education: Not on file   Highest education level: Not on file  Occupational History   Occupation: Education officer, museum  Tobacco Use   Smoking status: Never   Smokeless tobacco: Never  Vaping Use   Vaping Use: Never used  Substance and Sexual Activity   Alcohol use: Not Currently    Comment: social-twice a year   Drug use: No   Sexual activity: Not on file  Other Topics Concern   Not on file  Social History Narrative   Not on file   Social Determinants of Health   Financial Resource Strain: Not on file  Food Insecurity: Not on file  Transportation Needs: Not on file  Physical Activity: Not on file  Stress: Not on file  Social Connections: Not on file  Intimate Partner Violence: Not on file   Past Surgical History:  Procedure Laterality Date   ABDOMINAL HYSTERECTOMY  08/13/2017   left ovary removed   BREAST BIOPSY  Bilateral 1888/1191/1994/2001   remove growth from both side breast   ENDOMETRIAL ABLATION     THYROIDECTOMY  08/1999   partial   Family History  Adopted: Yes  Problem Relation Age of Onset   Breast cancer Mother        and 2 other types but don't know what kind   Skin cancer Mother     Current Outpatient Medications:    cetirizine (ZYRTEC) 10 MG tablet, Take 10 mg by mouth at bedtime., Disp: , Rfl:    Cholecalciferol (VITAMIN D) 2000 UNITS tablet, Take 4,000 Units by mouth at bedtime. , Disp: , Rfl:    FLUoxetine (PROZAC) 20 MG capsule, Take 1 capsule (20 mg total) by mouth daily. (NEEDS TO BE SEEN BEFORE NEXT REFILL), Disp: 90 capsule, Rfl: 0   Ketotifen Fumarate (ALLERGY EYE DROPS OP), Apply 1 drop to eye daily as needed (Allergies)., Disp: , Rfl:    metoprolol succinate (TOPROL-XL) 25 MG 24 hr tablet, Take 1 tablet (25 mg total) by mouth daily. (Patient taking differently: Take 12.5 mg by mouth daily.), Disp: 90 tablet, Rfl: 3   naproxen sodium (ALEVE) 220 MG tablet, Take 220 mg by mouth daily as needed (Back pain)., Disp: , Rfl:    tretinoin (RETIN-A) 0.05 % cream, Apply 1 application topically 3 (three) times a week. , Disp: , Rfl:    triamterene-hydrochlorothiazide (MAXZIDE) 75-50  MG tablet, Take 0.5 tablets by mouth daily., Disp: 45 tablet, Rfl: 1   ketoconazole (NIZORAL) 2 % shampoo, Apply 1 application topically as needed for irritation. (Patient not taking: Reported on 04/15/2021), Disp: , Rfl:   No Known Allergies   ROS: Review of Systems A comprehensive review of systems was negative except for: Behavioral/Psych: positive for anxiety (recently got out of an emotionally unhealthy relationship, seeing counseling and her PCP at [facility] for this) doing somewhat better on Prozac 53m.  Considering an increase soon    Physical exam BP 133/85   Pulse 64   Temp 98.3 F (36.8 C)   Ht '5\' 9"'  (1.753 m)   Wt 236 lb 12.8 oz (107.4 kg)   LMP 06/22/2016   SpO2 99%   BMI 34.97 kg/m   General appearance: alert, cooperative, appears stated age, no distress, and mildly obese Head: Normocephalic, without obvious abnormality, atraumatic Eyes: negative findings: lids and lashes normal, conjunctivae and sclerae normal, corneas clear, and pupils equal, round, reactive to light and accomodation Ears: normal TM's and external ear canals both ears Nose: Nares normal. Septum midline. Mucosa normal. No drainage or sinus tenderness. Throat: lips, mucosa, and tongue normal; teeth and gums normal Neck: no adenopathy, no carotid bruit, supple, symmetrical, trachea midline, and thyroid not enlarged, symmetric, no tenderness/mass/nodules Back: symmetric, no curvature. ROM normal. No CVA tenderness. Lungs: clear to auscultation bilaterally Heart: regular rate and rhythm, S1, S2 normal, no murmur, click, rub or gallop Abdomen: soft, non-tender; bowel sounds normal; no masses,  no organomegaly Extremities: extremities normal, atraumatic, no cyanosis or edema Pulses: 2+ and symmetric Skin: Skin color, texture, turgor normal. No rashes or lesions Lymph nodes: Cervical, supraclavicular, and axillary nodes normal. Neurologic: Grossly normal Psych: anxious but very pleasant and interactive [facility] 04/15/2021 10/10/2020 06/12/2020 01/13/2020 12/08/2019  Decreased Interest 1 0 0 2 0  Down, Depressed, Hopeless '1 1 1 2 ' 0  PHQ - 2 Score '2 1 1 4 ' 0  Altered sleeping '3 2 2 2 ' -  Tired, decreased energy '3 1 1 1 ' -  Change in appetite 2 0 0 0 -  Feeling bad or failure about yourself  '1 1 1 2 ' -  Trouble concentrating '3 1 1 1 ' -  Moving slowly or fidgety/restless 1 0 0 0 -  Suicidal thoughts 1 0 0 0 -  PHQ-9 Score '16 6 6 10 ' -  Difficult doing work/chores Very difficult Not difficult at all Not difficult at all Very difficult -   GAD 7 : Generalized Anxiety Score 04/15/2021 10/10/2020 06/12/2020 01/13/2020  Nervous, Anxious, on Edge '2 2 2 3  ' Control/stop worrying '3 1 1 2  ' Worry too much -  different things '3 1 1 2  ' Trouble relaxing '3 2 2 1  ' Restless '3 2 2 1  ' Easily annoyed or irritable 3 0 0 1  Afraid - awful might happen '3 2 2 2  ' Total GAD 7 Score '20 10 10 12  ' Anxiety Difficulty Very difficult Somewhat difficult - Very difficult     Assessment/ Plan: THinton Raohere for annual physical exam.   Annual physical exam  Hx of hysterectomy  HTN (hypertension), benign - Plan: CMP14+EGFR, LDL Cholesterol, Direct  Elevated serum glucose - Plan: Bayer DCA Hb A1c Waived  Adjustment disorder with mixed anxiety and depressed mood - Plan: TSH  PTSD (post-traumatic stress disorder) - Plan: TSH  Screening, anemia, deficiency, iron - Plan: CBC  History of partial thyroidectomy - Plan:  TSH, T4, Free  Need for immunization against influenza - Plan: Flu Vaccine QUAD 45moIM (Fluarix, Fluzone & Alfiuria Quad PF)  [place]  Blood pressure is controlled.  No changes.  Check nonfasting direct LDL, CMP and A1c given elevated serum glucose on previous exam  TSH, free T4 ordered given history of partial thyroidectomy  We talked about weight loss.  She is considering [place] if she is not as successful as she wishes to be with keto dieting.  Would like to see her back in about 3 months to repeat fasting cholesterol panel.  Influenza vaccination administered  Counseled on healthy lifestyle choices, including diet (rich in fruits, vegetables and lean meats and low in salt and simple carbohydrates) and exercise (at least 30 minutes of moderate physical activity daily).  Patient to follow up in 1 year for annual exam or sooner if needed.  Fritz Cauthon M. GLajuana Ripple DO

## 2021-04-15 NOTE — Patient Instructions (Signed)
You had labs performed today.  You will be contacted with the results of the labs once they are available, usually in the next 3 business days for routine lab work.  If you have an active my chart account, they will be released to your MyChart.  If you prefer to have these labs released to you via telephone, please let us know.  Have pap smear results sent to me  [facility] and Kirke Shaggy are the injectables for weight loss.  Let me know if you want to start

## 2021-04-16 LAB — CMP14+EGFR
ALT: 20 IU/L (ref 0–32)
AST: 23 IU/L (ref 0–40)
Albumin/Globulin Ratio: 1.8 (ref 1.2–2.2)
Albumin: 4.2 g/dL (ref 3.8–4.9)
Alkaline Phosphatase: 65 IU/L (ref 44–121)
BUN/Creatinine Ratio: 15 (ref 9–23)
BUN: 12 mg/dL (ref 6–24)
Bilirubin Total: 0.3 mg/dL (ref 0.0–1.2)
CO2: 26 mmol/L (ref 20–29)
Calcium: 9.5 mg/dL (ref 8.7–10.2)
Chloride: 99 mmol/L (ref 96–106)
Creatinine, Ser: 0.78 mg/dL (ref 0.57–1.00)
Globulin, Total: 2.3 g/dL (ref 1.5–4.5)
Glucose: 92 mg/dL (ref 70–99)
Potassium: 4.1 mmol/L (ref 3.5–5.2)
Sodium: 139 mmol/L (ref 134–144)
Total Protein: 6.5 g/dL (ref 6.0–8.5)
eGFR: 92 mL/min/{1.73_m2} (ref >59)

## 2021-04-16 LAB — CBC
Hematocrit: 36.8 % (ref 34.0–46.6)
Hemoglobin: 12.8 g/dL (ref 11.1–15.9)
MCH: 28.8 pg (ref 26.6–33.0)
MCHC: 34.8 g/dL (ref 31.5–35.7)
MCV: 83 fL (ref 79–97)
Platelets: 206 10*3/uL (ref 150–450)
RBC: 4.45 x10E6/uL (ref 3.77–5.28)
RDW: 13.4 % (ref 11.7–15.4)
WBC: 3.8 10*3/uL (ref 3.4–10.8)

## 2021-04-16 LAB — T4, FREE: Free T4: 1.13 ng/dL (ref 0.82–1.77)

## 2021-04-16 LAB — TSH: TSH: 1.56 u[IU]/mL (ref 0.450–4.500)

## 2021-04-16 LAB — LDL CHOLESTEROL, DIRECT: LDL Direct: 94 mg/dL (ref 0–99)

## 2021-04-30 ENCOUNTER — Ambulatory Visit (INDEPENDENT_AMBULATORY_CARE_PROVIDER_SITE_OTHER): Payer: Medicare HMO

## 2021-04-30 VITALS — Ht 69.0 in | Wt 236.0 lb

## 2021-04-30 DIAGNOSIS — Z0000 Encounter for general adult medical examination without abnormal findings: Secondary | ICD-10-CM

## 2021-04-30 NOTE — Progress Notes (Signed)
Subjective:   Lisa Boyer is a 50 y.o. female who presents for an Initial Medicare Annual Wellness Visit.  Virtual Visit via Telephone Note  I connected with  Lisa Boyer on 04/30/21 at  3:30 PM EST by telephone and verified that I am speaking with the correct person using two identifiers.  Location: Patient: Home Provider: [facility] Persons participating in the virtual visit: patient/Nurse Health Advisor   I discussed the limitations, risks, security and privacy concerns of performing an evaluation and management service by telephone and the availability of in person appointments. The patient expressed understanding and agreed to proceed.  Interactive audio and video telecommunications were attempted between this nurse and patient, however failed, due to patient having technical difficulties OR patient did not have access to video capability.  We continued and completed visit with audio only.  Some vital signs may be absent or patient reported.   Lisa Boyer E Fredrico Beedle, LPN   Review of Systems     Cardiac Risk Factors include: obesity (BMI >30kg/m2);sedentary lifestyle;hypertension     Objective:    Today's Vitals   04/30/21 1604 04/30/21 1607  Weight: 236 lb (107 kg)   Height: 5\' 9"  (1.753 m)   PainSc:  4    Body mass index is 34.85 kg/m.  Advanced Directives 04/30/2021 11/19/2019 09/06/2014  Does Patient Have a Medical Advance Directive? No No Yes  Type of Advance Directive - - Living will  Does patient want to make changes to medical advance directive? - - No - Patient declined  Copy of [place] in Chart? - - No - copy requested  Would patient like information on creating a medical advance directive? No - Patient declined No - Patient declined -    Current Medications (verified) Outpatient Encounter Medications as of 04/30/2021  Medication Sig   Biotin w/ Vitamins C & E (HAIR/SKIN/NAILS PO) Take by mouth.   cetirizine (ZYRTEC) 10 MG tablet Take 10 mg by  mouth at bedtime.   Cholecalciferol (VITAMIN D) 2000 UNITS tablet Take 4,000 Units by mouth at bedtime.    FLUoxetine (PROZAC) 20 MG capsule Take 1 capsule (20 mg total) by mouth daily. (NEEDS TO BE SEEN BEFORE NEXT REFILL)   ketoconazole (NIZORAL) 2 % shampoo Apply 1 application topically as needed for irritation.   Ketotifen Fumarate (ALLERGY EYE DROPS OP) Apply 1 drop to eye daily as needed (Allergies).   metoprolol succinate (TOPROL-XL) 25 MG 24 hr tablet Take 1 tablet (25 mg total) by mouth daily. (Patient taking differently: Take 12.5 mg by mouth daily.)   naproxen sodium (ALEVE) 220 MG tablet Take 220 mg by mouth daily as needed (Back pain).   tretinoin (RETIN-A) 0.05 % cream Apply 1 application topically 3 (three) times a week.    triamterene-hydrochlorothiazide (MAXZIDE) 75-50 MG tablet Take 0.5 tablets by mouth daily.   No facility-administered encounter medications on file as of 04/30/2021.    Allergies (verified) Patient has no known allergies.   History: Past Medical History:  Diagnosis Date   Allergy    Anemia    Anxiety    Back pain 04/1992   chronic back pain-diagnose from [place]   Depression    Headache    frequent headache   Hypertension    PTSD (post-traumatic stress disorder)    Thyroid disease    Uterine fibroid    Past Surgical History:  Procedure Laterality Date   ABDOMINAL HYSTERECTOMY  08/13/2017   left ovary removed   BREAST BIOPSY Bilateral  1888/1191/1994/2001   remove growth from both side breast   ENDOMETRIAL ABLATION     THYROIDECTOMY  08/1999   partial   Family History  Adopted: Yes  Problem Relation Age of Onset   Breast cancer Mother        and 2 other types but don't know what kind   Skin cancer Mother    Social History   Socioeconomic History   Marital status: Widowed    Spouse name: Not on file   Number of children: 1   Years of education: Not on file   Highest education level: Not on file  Occupational History   Occupation:  Education officer, museum  Tobacco Use   Smoking status: Never   Smokeless tobacco: Never  Vaping Use   Vaping Use: Never used  Substance and Sexual Activity   Alcohol use: Not Currently    Comment: social-twice a year   Drug use: No   Sexual activity: Not on file  Other Topics Concern   Not on file  Social History Narrative   Son lives nearby and visits daily   Social Determinants of Health   Financial Resource Strain: Low Risk    Difficulty of Paying Living Expenses: Not hard at all  Food Insecurity: No Food Insecurity   Worried About Charity fundraiser in the Last Year: Never true   Arboriculturist in the Last Year: Never true  Transportation Needs: No Transportation Needs   Lack of Transportation (Medical): No   Lack of Transportation (Non-Medical): No  Physical Activity: Not on file  Stress: Not on file  Social Connections: Moderately Integrated   Frequency of Communication with Friends and Family: More than three times a week   Frequency of Social Gatherings with Friends and Family: More than three times a week   Attends Religious Services: More than 4 times per year   Active Member of [employer] or Organizations: Yes   Attends Archivist Meetings: More than 4 times per year   Marital Status: Widowed    Tobacco Counseling Counseling given: Not Answered   Clinical Intake:  Pre-visit preparation completed: Yes  Pain : 0-10 Pain Score: 4  Pain Type: Chronic pain Pain Location: Back Pain Orientation: Mid, Lower Pain Descriptors / Indicators: Aching, Discomfort, Tightness Pain Onset: More than a month ago Pain Frequency: Intermittent     BMI - recorded: 34.85 Nutritional Status: BMI > 30  Obese Nutritional Risks: None Diabetes: No  How often do you need to have someone help you when you read instructions, pamphlets, or other written materials from your doctor or pharmacy?: 1 - Never  Diabetic? no  Interpreter Needed?: No  Information entered by :: Atira Borello, LPN   Activities of Daily Living In your present state of health, do you have any difficulty performing the following activities: 04/30/2021  Hearing? N  Vision? N  Difficulty concentrating or making decisions? N  Walking or climbing stairs? Y  Comment only when back hurts  Dressing or bathing? N  Doing errands, shopping? N  Preparing Food and eating ? N  Using the Toilet? N  In the past six months, have you accidently leaked urine? N  Do you have problems with loss of bowel control? N  Managing your Medications? N  Managing your Finances? N  Housekeeping or managing your Housekeeping? N  Some recent data might be hidden    Patient Care Team: Janora Norlander, DO as PCP - General (Family Medicine)  Indicate any recent Medical Services you may have received from other than [facility] providers in the past year (date may be approximate).     Assessment:   This is a routine wellness examination for [place].  Hearing/Vision screen Hearing Screening - Comments:: Denies hearing difficulties  Vision Screening - Comments:: Wears otc reading glasses prn - up to date with annual eye exams [facility] in [place] issues and exercise activities discussed: Current Exercise Habits: Home exercise routine, Type of exercise: walking;stretching, Time (Minutes): 20, Frequency (Times/Week): 5, Weekly Exercise (Minutes/Week): 100, Intensity: Mild, Exercise limited by: orthopedic condition(s)   Goals Addressed             This Visit's Progress    Achieve a Healthy Weight-Obesity       Timeframe:  Long-Range Goal Priority:  High Start Date:                             Expected End Date:                       Follow Up Date 11//30/2023    - drink 6 to 8 glasses of water each day - eat 5 or 6 small meals each day - eat fish at least once per week - join a weight loss program - manage portion size - read food labels for fat, fiber, carbohydrates and portion size - set goal  weight          Depression Screen PHQ 2/9 Scores 04/30/2021 04/15/2021 10/10/2020 06/12/2020 05/04/2020 01/13/2020 12/08/2019  PHQ - 2 Score 1 2 1 1  - 4 0  PHQ- 9 Score 8 16 6 6  - 10 -  Exception Documentation - - - - Patient refusal - -    Fall Risk Fall Risk  04/30/2021 04/15/2021 10/10/2020 05/04/2020 12/08/2019  Falls in the past year? 0 0 0 0 0  Number falls in past yr: 0 - - - -  Injury with Fall? 0 - - - -  Risk for fall due to : Orthopedic patient - - - -  Follow up Falls prevention discussed - - - -    FALL [place]:  Any stairs in or around the home? No  If so, are there any without handrails? No  Home free of loose throw rugs in walkways, pet beds, electrical cords, etc? Yes  Adequate lighting in your home to reduce risk of falls? Yes   ASSISTIVE DEVICES UTILIZED TO PREVENT FALLS:  Life alert? No  Use of a cane, walker or w/c? No  Grab bars in the bathroom? Yes  Shower chair or bench in shower? Yes  Elevated toilet seat or a handicapped toilet? Yes   TIMED UP AND GO:  Was the test performed? No . Telephonic visit  Cognitive Function:     6CIT Screen 04/30/2021  What Year? 0 points  What month? 0 points  What time? 0 points  Count back from 20 0 points  Months in reverse 2 points  Repeat phrase 2 points  Total Score 4    Immunizations Immunization History  Administered Date(s) Administered   Influenza,inj,Quad PF,6+ Mos 04/15/2021   Influenza-Unspecified 06/10/2020   PFIZER(Purple Top)SARS-COV-2 Vaccination 07/30/2019, 08/20/2019   Zoster Recombinat (Shingrix) 04/06/2020, 09/26/2020    TDAP status: Up to date  Flu Vaccine status: Up to date  Pneumococcal vaccine status: Due, Education has been provided regarding  the importance of this vaccine. Advised may receive this vaccine at local pharmacy or Health Dept. Aware to provide a copy of the vaccination record if obtained from local pharmacy or Health Dept. Verbalized  acceptance and understanding.  Covid-19 vaccine status: Completed vaccines  Qualifies for Shingles Vaccine? Yes   Zostavax completed Yes   Shingrix Completed?: Yes  Screening Tests Health Maintenance  Topic Date Due   PAP SMEAR-Modifier  Never done   COVID-19 Vaccine (3 - Booster for Pfizer series) 05/01/2021 (Originally 10/15/2019)   MAMMOGRAM  10/10/2021 (Originally 05/15/2021)   COLONOSCOPY (Pts 45-15yrs Insurance coverage will need to be confirmed)  10/10/2021 (Originally 06/29/2014)   TETANUS/TDAP  10/10/2021 (Originally 06/29/1988)   Hepatitis C Screening  10/10/2021 (Originally 06/30/1987)   HIV Screening  10/10/2021 (Originally 06/29/1984)   INFLUENZA VACCINE  Completed   Zoster Vaccines- Shingrix  Completed   Pneumococcal Vaccine 104-13 Years old  Aged Out   HPV [place] Maintenance Due  Topic Date Due   PAP SMEAR-Modifier  Never done    Colorectal Cancer screening: due   Mammogram status: Completed 05/15/2020. Repeat every year  Bone Density Scan due at age 51  Lung Cancer Screening: (Low Dose CT Chest recommended if Age 68-80 years, 30 pack-year currently smoking OR have quit w/in 15years.) does not qualify.   Additional Screening:  Hepatitis C Screening: does not qualify  Vision Screening: Recommended annual ophthalmology exams for early detection of glaucoma and other disorders of the eye. Is the patient up to date with their annual eye exam?  Yes  Who is the provider or what is the name of the office in which the patient attends annual eye exams? [facility] in [place] If pt is not established with a provider, would they like to be referred to a provider to establish care? No .   Dental Screening: Recommended annual dental exams for proper oral hygiene  Community Resource Referral / Chronic Care Management: CRR required this visit?  No   CCM required this visit?  No      Plan:     I have personally reviewed and  noted the following in the patient's chart:   Medical and social history Use of alcohol, tobacco or illicit drugs  Current medications and supplements including opioid prescriptions. Patient is not currently taking opioid prescriptions. Functional ability and status Nutritional status Physical activity Advanced directives List of other physicians Hospitalizations, surgeries, and ER visits in previous 12 months Vitals Screenings to include cognitive, depression, and falls Referrals and appointments  In addition, I have reviewed and discussed with patient certain preventive protocols, quality metrics, and best practice recommendations. A written personalized care plan for preventive services as well as general preventive health recommendations were provided to patient.     Sandrea Hammond, LPN   [id number]   Nurse Notes: gets most screenings and vaccines at the [place] in [facility]

## 2021-04-30 NOTE — Patient Instructions (Signed)
Lisa Boyer , Thank you for taking time to come for your Medicare Wellness Visit. I appreciate your ongoing commitment to your health goals. Please review the following plan we discussed and let me know if I can assist you in the future.   Screening recommendations/referrals: Colonoscopy: Due Mammogram: done 05/15/2020 - appointment 05/14/2021 at [place]: Due at age 50 Recommended yearly ophthalmology/optometry visit for glaucoma screening and checkup Recommended yearly dental visit for hygiene and checkup  Vaccinations: Influenza vaccine: Done 04/15/2021 - Repeat annually  Pneumococcal vaccine: Due Tdap vaccine: Done at [facility] - we need dates Shingles vaccine: Done 04/06/2020 & 09/26/2020  Covid-19: Done 07/30/2019 & 08/20/2019 - due for booster  Advanced directives: Please bring a copy of your health care power of attorney and living will to the office to be added to your chart at your convenience.   Conditions/risks identified: Aim for 30 minutes of exercise or brisk walking each day, drink 6-8 glasses of water and eat lots of fruits and vegetables.   Next appointment: Follow up in one year for your annual wellness visit.   Preventive Care 40-64 Years, Female Preventive care refers to lifestyle choices and visits with your health care provider that can promote health and wellness. What does preventive care include? A yearly physical exam. This is also called an annual well check. Dental exams once or twice a year. Routine eye exams. Ask your health care provider how often you should have your eyes checked. Personal lifestyle choices, including: Daily care of your teeth and gums. Regular physical activity. Eating a healthy diet. Avoiding tobacco and drug use. Limiting alcohol use. Practicing safe sex. Taking low-dose aspirin daily starting at age 2. Taking vitamin and mineral supplements as recommended by your health care provider. What happens during an annual well  check? The services and screenings done by your health care provider during your annual well check will depend on your age, overall health, lifestyle risk factors, and family history of disease. Counseling  Your health care provider may ask you questions about your: Alcohol use. Tobacco use. Drug use. Emotional well-being. Home and relationship well-being. Sexual activity. Eating habits. Work and work Statistician. Method of birth control. Menstrual cycle. Pregnancy history. Screening  You may have the following tests or measurements: Height, weight, and BMI. Blood pressure. Lipid and cholesterol levels. These may be checked every 5 years, or more frequently if you are over 44 years old. Skin check. Lung cancer screening. You may have this screening every year starting at age 62 if you have a 30-pack-year history of smoking and currently smoke or have quit within the past 15 years. Fecal occult blood test (FOBT) of the stool. You may have this test every year starting at age 3. Flexible sigmoidoscopy or colonoscopy. You may have a sigmoidoscopy every 5 years or a colonoscopy every 10 years starting at age 71. Hepatitis C blood test. Hepatitis B blood test. Sexually transmitted disease (STD) testing. Diabetes screening. This is done by checking your blood sugar (glucose) after you have not eaten for a while (fasting). You may have this done every 1-3 years. Mammogram. This may be done every 1-2 years. Talk to your health care provider about when you should start having regular mammograms. This may depend on whether you have a family history of breast cancer. BRCA-related cancer screening. This may be done if you have a family history of breast, ovarian, tubal, or peritoneal cancers. Pelvic exam and Pap test. This may be done every  3 years starting at age 84. Starting at age 23, this may be done every 5 years if you have a Pap test in combination with an HPV test. Bone density scan. This  is done to screen for osteoporosis. You may have this scan if you are at high risk for osteoporosis. Discuss your test results, treatment options, and if necessary, the need for more tests with your health care provider. Vaccines  Your health care provider may recommend certain vaccines, such as: Influenza vaccine. This is recommended every year. Tetanus, diphtheria, and acellular pertussis (Tdap, Td) vaccine. You may need a Td booster every 10 years. Zoster vaccine. You may need this after age 60. Pneumococcal 13-valent conjugate (PCV13) vaccine. You may need this if you have certain conditions and were not previously vaccinated. Pneumococcal polysaccharide (PPSV23) vaccine. You may need one or two doses if you smoke cigarettes or if you have certain conditions. Talk to your health care provider about which screenings and vaccines you need and how often you need them. This information is not intended to replace advice given to you by your health care provider. Make sure you discuss any questions you have with your health care provider. Document Released: 06/15/2015 Document Revised: 02/06/2016 Document Reviewed: 03/20/2015 Elsevier Interactive Patient Education  2017 [employer] Prevention in the Home Falls can cause injuries. They can happen to people of all ages. There are many things you can do to make your home safe and to help prevent falls. What can I do on the outside of my home? Regularly fix the edges of walkways and driveways and fix any cracks. Remove anything that might make you trip as you walk through a door, such as a raised step or threshold. Trim any bushes or trees on the path to your home. Use bright outdoor lighting. Clear any walking paths of anything that might make someone trip, such as rocks or tools. Regularly check to see if handrails are loose or broken. Make sure that both sides of any steps have handrails. Any raised decks and porches should have  guardrails on the edges. Have any leaves, snow, or ice cleared regularly. Use sand or salt on walking paths during winter. Clean up any spills in your garage right away. This includes oil or grease spills. What can I do in the bathroom? Use night lights. Install grab bars by the toilet and in the tub and shower. Do not use towel bars as grab bars. Use non-skid mats or decals in the tub or shower. If you need to sit down in the shower, use a plastic, non-slip stool. Keep the floor dry. Clean up any water that spills on the floor as soon as it happens. Remove soap buildup in the tub or shower regularly. Attach bath mats securely with double-sided non-slip rug tape. Do not have throw rugs and other things on the floor that can make you trip. What can I do in the bedroom? Use night lights. Make sure that you have a light by your bed that is easy to reach. Do not use any sheets or blankets that are too big for your bed. They should not hang down onto the floor. Have a firm chair that has side arms. You can use this for support while you get dressed. Do not have throw rugs and other things on the floor that can make you trip. What can I do in the kitchen? Clean up any spills right away. Avoid walking on wet  floors. Keep items that you use a lot in easy-to-reach places. If you need to reach something above you, use a strong step stool that has a grab bar. Keep electrical cords out of the way. Do not use floor polish or wax that makes floors slippery. If you must use wax, use non-skid floor wax. Do not have throw rugs and other things on the floor that can make you trip. What can I do with my stairs? Do not leave any items on the stairs. Make sure that there are handrails on both sides of the stairs and use them. Fix handrails that are broken or loose. Make sure that handrails are as long as the stairways. Check any carpeting to make sure that it is firmly attached to the stairs. Fix any carpet  that is loose or worn. Avoid having throw rugs at the top or bottom of the stairs. If you do have throw rugs, attach them to the floor with carpet tape. Make sure that you have a light switch at the top of the stairs and the bottom of the stairs. If you do not have them, ask someone to add them for you. What else can I do to help prevent falls? Wear shoes that: Do not have high heels. Have rubber bottoms. Are comfortable and fit you well. Are closed at the toe. Do not wear sandals. If you use a stepladder: Make sure that it is fully opened. Do not climb a closed stepladder. Make sure that both sides of the stepladder are locked into place. Ask someone to hold it for you, if possible. Clearly mark and make sure that you can see: Any grab bars or handrails. First and last steps. Where the edge of each step is. Use tools that help you move around (mobility aids) if they are needed. These include: Canes. Walkers. Scooters. Crutches. Turn on the lights when you go into a dark area. Replace any light bulbs as soon as they burn out. Set up your furniture so you have a clear path. Avoid moving your furniture around. If any of your floors are uneven, fix them. If there are any pets around you, be aware of where they are. Review your medicines with your doctor. Some medicines can make you feel dizzy. This can increase your chance of falling. Ask your doctor what other things that you can do to help prevent falls. This information is not intended to replace advice given to you by your health care provider. Make sure you discuss any questions you have with your health care provider. Document Released: 03/15/2009 Document Revised: 10/25/2015 Document Reviewed: 06/23/2014 Elsevier Interactive Patient Education  2017 [employer].

## 2021-06-27 ENCOUNTER — Ambulatory Visit (INDEPENDENT_AMBULATORY_CARE_PROVIDER_SITE_OTHER): Payer: Medicare HMO | Admitting: Family Medicine

## 2021-06-27 ENCOUNTER — Encounter: Payer: Self-pay | Admitting: Family Medicine

## 2021-06-27 DIAGNOSIS — B9789 Other viral agents as the cause of diseases classified elsewhere: Secondary | ICD-10-CM | POA: Diagnosis not present

## 2021-06-27 DIAGNOSIS — J988 Other specified respiratory disorders: Secondary | ICD-10-CM | POA: Diagnosis not present

## 2021-06-27 LAB — VERITOR FLU A/B WAIVED
Influenza A: NEGATIVE
Influenza B: NEGATIVE

## 2021-06-27 NOTE — Progress Notes (Signed)
Virtual Visit via telephone Note  I connected with Lisa Boyer on 06/27/21 at 1345 by telephone and verified that I am speaking with the correct person using two identifiers. Lisa Boyer is currently located at home and patient are currently with her during visit. The provider, Fransisca Kaufmann Mattilynn Forrer, MD is located in their office at time of visit.  Call ended at 1352  I discussed the limitations, risks, security and privacy concerns of performing an evaluation and management service by telephone and the availability of in person appointments. I also discussed with the patient that there may be a patient responsible charge related to this service. The patient expressed understanding and agreed to proceed.   History and Present Illness: Patient is calling in for cough and congestion that started 3 days ago.  She also developed headache and loss of appetite and fatigue and body aches.  She is having some sinus pressure. It worsened over that day and has developed a fever now.   Temp 99.6 and 100.6, and she has been taking nyquil to help.  She is coughing up phlegm.  She did a home covid test and it was negative. She denies SOB or wheezing.  She has been resting in bed most of the day. She has flu and covid vaccines.   1. Viral respiratory illness     Outpatient Encounter Medications as of 06/27/2021  Medication Sig   Biotin w/ Vitamins C & E (HAIR/SKIN/NAILS PO) Take by mouth.   cetirizine (ZYRTEC) 10 MG tablet Take 10 mg by mouth at bedtime.   Cholecalciferol (VITAMIN D) 2000 UNITS tablet Take 4,000 Units by mouth at bedtime.    FLUoxetine (PROZAC) 20 MG capsule Take 1 capsule (20 mg total) by mouth daily. (NEEDS TO BE SEEN BEFORE NEXT REFILL)   ketoconazole (NIZORAL) 2 % shampoo Apply 1 application topically as needed for irritation.   Ketotifen Fumarate (ALLERGY EYE DROPS OP) Apply 1 drop to eye daily as needed (Allergies).   metoprolol succinate (TOPROL-XL) 25 MG 24 hr tablet Take 1  tablet (25 mg total) by mouth daily. (Patient taking differently: Take 12.5 mg by mouth daily.)   naproxen sodium (ALEVE) 220 MG tablet Take 220 mg by mouth daily as needed (Back pain).   tretinoin (RETIN-A) 0.05 % cream Apply 1 application topically 3 (three) times a week.    triamterene-hydrochlorothiazide (MAXZIDE) 75-50 MG tablet Take 0.5 tablets by mouth daily.   No facility-administered encounter medications on file as of 06/27/2021.    Review of Systems  Constitutional:  Positive for chills and fever.  HENT:  Positive for congestion, postnasal drip, rhinorrhea, sinus pressure, sinus pain and sore throat. Negative for ear discharge, ear pain and sneezing.   Eyes:  Negative for pain, redness and visual disturbance.  Respiratory:  Positive for cough. Negative for chest tightness and shortness of breath.   Cardiovascular:  Negative for chest pain and leg swelling.  Genitourinary:  Negative for difficulty urinating and dysuria.  Musculoskeletal:  Positive for myalgias. Negative for back pain and gait problem.  Skin:  Negative for rash.  Neurological:  Negative for light-headedness and headaches.  Psychiatric/Behavioral:  Negative for agitation and behavioral problems.   All other systems reviewed and are negative.  Observations/Objective: Patient sounds comfortable and in no acute distress  Assessment and Plan: Problem List Items Addressed This Visit   None Visit Diagnoses     Viral respiratory illness    -  Primary   Relevant Orders   Veritor  Flu A/B Waived   Novel Coronavirus, NAA ([facility])       Will test for flu and covid, recommended over-the-counter medicine such as NyQuil and Tylenol to help with the symptoms and to let us know if anything worsens.  She will come up and get tested for flu and COVID  Follow up plan: Return if symptoms worsen or fail to improve.     I discussed the assessment and treatment plan with the patient. The patient was provided an opportunity  to ask questions and all were answered. The patient agreed with the plan and demonstrated an understanding of the instructions.   The patient was advised to call back or seek an in-person evaluation if the symptoms worsen or if the condition fails to improve as anticipated.  The above assessment and management plan was discussed with the patient. The patient verbalized understanding of and has agreed to the management plan. Patient is aware to call the clinic if symptoms persist or worsen. Patient is aware when to return to the clinic for a follow-up visit. Patient educated on when it is appropriate to go to the emergency department.    I provided 7 minutes of non-face-to-face time during this encounter.    Worthy Rancher, MD

## 2021-06-28 LAB — SARS-COV-2, NAA 2 DAY TAT

## 2021-06-28 LAB — NOVEL CORONAVIRUS, NAA: SARS-CoV-2, NAA: DETECTED — AB

## 2021-07-17 ENCOUNTER — Ambulatory Visit: Payer: Medicare HMO | Admitting: Family Medicine

## 2021-08-14 ENCOUNTER — Encounter: Payer: Self-pay | Admitting: Family Medicine

## 2021-08-14 ENCOUNTER — Ambulatory Visit (INDEPENDENT_AMBULATORY_CARE_PROVIDER_SITE_OTHER): Payer: Medicare HMO | Admitting: Family Medicine

## 2021-08-14 VITALS — BP 122/83 | HR 77 | Temp 98.0°F | Ht 69.0 in | Wt 243.0 lb

## 2021-08-14 DIAGNOSIS — E669 Obesity, unspecified: Secondary | ICD-10-CM | POA: Diagnosis not present

## 2021-08-14 DIAGNOSIS — F4310 Post-traumatic stress disorder, unspecified: Secondary | ICD-10-CM | POA: Diagnosis not present

## 2021-08-14 DIAGNOSIS — F4323 Adjustment disorder with mixed anxiety and depressed mood: Secondary | ICD-10-CM

## 2021-08-14 DIAGNOSIS — R7303 Prediabetes: Secondary | ICD-10-CM

## 2021-08-14 LAB — BAYER DCA HB A1C WAIVED: HB A1C (BAYER DCA - WAIVED): 5.9 % — ABNORMAL HIGH (ref 4.8–5.6)

## 2021-08-14 NOTE — Patient Instructions (Signed)
We will do fasting labs at your next visit.  ? ?Please call me and let me know if the [facility] gets your referral renewed.  If not, I can place one on the civilian side. ? ?Check with that number I gave you about [facility] or Saxenda.  If they cover it, I'm glad to start you on it if you want. ? ? ?

## 2021-08-14 NOTE — Progress Notes (Signed)
? ?Subjective: ?CC: Follow-up anxiety, weight, PreDM ?PCP: Janora Norlander, DO ?BOF:[facility] Lisa Boyer is a 50 y.o. female presenting to clinic today for: ? ?1.  Obesity, PreDM ?Patient was seen back in November.  At that time she was really working on weight loss as her A1c was shown to be in the prediabetic range now.  She notes that she had successfully lost some weight but unfortunately in the last couple of months her mental health has not been well controlled.  She is still awaiting a new referral from the [place] to her counselor.  She has not been able to engage with her counselor in the last couple of months.  She is compliant with her medications but notes that she really does not have anybody that she can talk to on a regular basis.  She has subsequently been self coping with food and she admits most of this are sugary and carb rich foods. ? ? ?ROS: Per HPI ? ?No Known Allergies ?Past Medical History:  ?Diagnosis Date  ? Allergy   ? Anemia   ? Anxiety   ? Back pain 04/1992  ? chronic back pain-diagnose from [place]  ? Depression   ? Headache   ? frequent headache  ? Hypertension   ? PTSD (post-traumatic stress disorder)   ? Thyroid disease   ? Uterine fibroid   ? ? ?Current Outpatient Medications:  ?  Biotin w/ Vitamins C & E (HAIR/SKIN/NAILS PO), Take by mouth., Disp: , Rfl:  ?  cetirizine (ZYRTEC) 10 MG tablet, Take 10 mg by mouth at bedtime., Disp: , Rfl:  ?  Cholecalciferol (VITAMIN D) 2000 UNITS tablet, Take 4,000 Units by mouth at bedtime. , Disp: , Rfl:  ?  FLUoxetine (PROZAC) 20 MG capsule, Take 1 capsule (20 mg total) by mouth daily. (NEEDS TO BE SEEN BEFORE NEXT REFILL), Disp: 90 capsule, Rfl: 0 ?  ketoconazole (NIZORAL) 2 % shampoo, Apply 1 application topically as needed for irritation., Disp: , Rfl:  ?  Ketotifen Fumarate (ALLERGY EYE DROPS OP), Apply 1 drop to eye daily as needed (Allergies)., Disp: , Rfl:  ?  metoprolol succinate (TOPROL-XL) 25 MG 24 hr tablet, Take 1 tablet (25 mg total) by mouth  daily. (Patient taking differently: Take 12.5 mg by mouth daily.), Disp: 90 tablet, Rfl: 3 ?  naproxen sodium (ALEVE) 220 MG tablet, Take 220 mg by mouth daily as needed (Back pain)., Disp: , Rfl:  ?  tretinoin (RETIN-A) 0.05 % cream, Apply 1 application topically 3 (three) times a week. , Disp: , Rfl:  ?  triamterene-hydrochlorothiazide (MAXZIDE) 75-50 MG tablet, Take 0.5 tablets by mouth daily., Disp: 45 tablet, Rfl: 1 ?Social History  ? ?Socioeconomic History  ? Marital status: Widowed  ?  Spouse name: Not on file  ? Number of children: 1  ? Years of education: Not on file  ? Highest education level: Not on file  ?Occupational History  ? Occupation: Education officer, museum  ?Tobacco Use  ? Smoking status: Never  ? Smokeless tobacco: Never  ?Vaping Use  ? Vaping Use: Never used  ?Substance and Sexual Activity  ? Alcohol use: Not Currently  ?  Comment: social-twice a year  ? Drug use: No  ? Sexual activity: Not on file  ?Other Topics Concern  ? Not on file  ?Social History Narrative  ? Son lives nearby and visits daily  ? ?Social Determinants of Health  ? ?Financial Resource Strain: Low Risk   ? Difficulty of Paying Living  Expenses: Not hard at all  ?Food Insecurity: No Food Insecurity  ? Worried About Charity fundraiser in the Last Year: Never true  ? Ran Out of Food in the Last Year: Never true  ?Transportation Needs: No Transportation Needs  ? Lack of Transportation (Medical): No  ? Lack of Transportation (Non-Medical): No  ?Physical Activity: Not on file  ?Stress: Not on file  ?Social Connections: Moderately Integrated  ? Frequency of Communication with Friends and Family: More than three times a week  ? Frequency of Social Gatherings with Friends and Family: More than three times a week  ? Attends Religious Services: More than 4 times per year  ? Active Member of Clubs or Organizations: Yes  ? Attends Archivist Meetings: More than 4 times per year  ? Marital Status: Widowed  ?Intimate Partner Violence: Not  At Risk  ? Fear of Current or Ex-Partner: No  ? Emotionally Abused: No  ? Physically Abused: No  ? Sexually Abused: No  ? ?Family History  ?Adopted: Yes  ?Problem Relation Age of Onset  ? Breast cancer Mother   ?     and 2 other types but don't know what kind  ? Skin cancer Mother   ? ? ?Objective: ?Office vital signs reviewed. ?BP 122/83   Pulse 77   Temp 98 ?F (36.7 ?C) (Temporal)   Ht '5\' 9"'  (1.753 m)   Wt 243 lb (110.2 kg)   LMP 06/22/2016   BMI 35.88 kg/m?  ? ?Physical Examination:  ?General: Awake, alert, anxious, tearful ?HEENT: Sclera injected.  Moist mucous membranes ?Cardio: regular rate and rhythm, S1S2 heard, no murmurs appreciated ?Pulm: clear to auscultation bilaterally, no wheezes, rhonchi or rales; normal work of breathing on room air ?Psych: Tearful, anxious ? ?Depression screen [facility] 2/9 08/14/2021 04/30/2021 04/15/2021  ?Decreased Interest '1 1 1  ' ?Down, Depressed, Hopeless 1 0 1  ?PHQ - 2 Score '2 1 2  ' ?Altered sleeping '2 2 3  ' ?Tired, decreased energy '2 2 3  ' ?Change in appetite '3 1 2  ' ?Feeling bad or failure about yourself  3 0 1  ?Trouble concentrating '3 1 3  ' ?Moving slowly or fidgety/restless '3 1 1  ' ?Suicidal thoughts 1 0 1  ?PHQ-9 Score '19 8 16  ' ?Difficult doing work/chores Very difficult Somewhat difficult Very difficult  ?Some recent data might be hidden  ? ?GAD 7 : Generalized Anxiety Score 08/14/2021 04/15/2021 10/10/2020 06/12/2020  ?Nervous, Anxious, on Edge '3 2 2 2  ' ?Control/stop worrying '3 3 1 1  ' ?Worry too much - different things '3 3 1 1  ' ?Trouble relaxing '3 3 2 2  ' ?Restless '3 3 2 2  ' ?Easily annoyed or irritable 3 3 0 0  ?Afraid - awful might happen '3 3 2 2  ' ?Total GAD 7 Score '21 20 10 10  ' ?Anxiety Difficulty Very difficult Very difficult Somewhat difficult -  ? ? ?Assessment/ Plan: ?51 y.o. female  ? ?Prediabetes - Plan: Bayer DCA Hb A1c Waived ? ?Obesity (BMI 35.0-39.9 without comorbidity) ? ?Adjustment disorder with mixed anxiety and depressed mood ? ?PTSD (post-traumatic stress  disorder) ? ?Prediabetes slightly worse than previous checkup with A1c rising to 5.9.  She has been counseled on carb modification.  I gave her information on Wegovy today and she will contact her insurance company to see if this is covered.  Would like to see her back in about 6 weeks just to make sure that she is getting her needs met.  I am very concerned that she is not seeing a counselor at this time and it sounds like that this is a shortcoming in the referral department on the [place] side.  I have offered a referral on the civilian side if she is interested to her previous counselor and she seemed interested in this if they cannot get the one from the [place] within the next week.  She will contact me about that ? ?No orders of the defined types were placed in this encounter. ? ?No orders of the defined types were placed in this encounter. ? ?Total time spent with patient 33 minutes.  Greater than 50% of encounter spent in coordination of care/counseling. ? ? ?Janora Norlander, DO ?[place] ?[phone] ? ?

## 2021-09-11 ENCOUNTER — Telehealth: Payer: Self-pay | Admitting: Family Medicine

## 2021-09-11 NOTE — Telephone Encounter (Signed)
Did she call her insurance to make sure her insurance will cover it?  That's the first step.  Once I know they will, I will send in the first few month's doses. ?

## 2021-09-11 NOTE — Telephone Encounter (Signed)
Pt called to let Dr Lajuana Ripple know that she has decided she does want to start the shots for weight loss, as discussed at her last visit. Wants to know what next steps are? ?

## 2021-09-11 NOTE — Telephone Encounter (Signed)
I tried calling her but no answer.  I DO think it's medically necessary both from a BMI and Pre diabetes standpoint.  Just let me know if she wants me to proceed with rx and where to send them ?

## 2021-09-11 NOTE — Telephone Encounter (Signed)
PT STATES INSURANCE SAYS IT WOULD BE COVERED AS LONG AS SHE IS MEDICALLY NECESSARY. PT WANTS TO TRY ON HER OWN AGAIN AND IF SHE ISNT HAVING LUCK BY MAY SHE WILL COME IN TO TALK ABOUT WEIGHT LOSS MEDS ?

## 2021-09-25 ENCOUNTER — Ambulatory Visit: Payer: Medicare HMO | Admitting: Family Medicine

## 2021-10-30 ENCOUNTER — Ambulatory Visit: Payer: Medicare HMO | Admitting: Family Medicine

## 2021-11-04 ENCOUNTER — Encounter: Payer: Self-pay | Admitting: Family Medicine

## 2021-11-04 ENCOUNTER — Ambulatory Visit (INDEPENDENT_AMBULATORY_CARE_PROVIDER_SITE_OTHER): Payer: Medicare HMO | Admitting: Family Medicine

## 2021-11-04 DIAGNOSIS — R7303 Prediabetes: Secondary | ICD-10-CM | POA: Diagnosis not present

## 2021-11-04 DIAGNOSIS — I10 Essential (primary) hypertension: Secondary | ICD-10-CM

## 2021-11-04 MED ORDER — NOVOFINE PLUS PEN NEEDLE 32G X 4 MM MISC: 3 refills | Status: AC

## 2021-11-04 MED ORDER — SAXENDA 18 MG/3ML ~~LOC~~ SOPN: PEN_INJECTOR | SUBCUTANEOUS | 1 refills | Status: DC

## 2021-11-04 NOTE — Patient Instructions (Signed)
Tips for success with Saxenda (and by success, how not to be super sick on your stomach): Eat small meals AVOID heavy foods (fried/ high in carbs like bread, pasta, rice) AVOID carbonated beverages (soda/ beer, as these can increase bloating) DOUBLE your water intake (will help you avoid constipation/ dehydration)  Saxenda CAN cause: Nausea Abdominal pain Increased acid reflux (sometimes presents as "sour burps") Constipation OR Diarrhea Fatigue (especially when you first start it)

## 2021-11-04 NOTE — Progress Notes (Signed)
Subjective: CC: Morbid obesity PCP: Lisa Norlander, DO Lisa Boyer is a 51 y.o. female presenting to clinic today for:  1.  Morbid obesity associated with impaired fasting glucose, hypertension Patient is compliant with metoprolol, triamterene-hydrochlorothiazide.  No reports of chest pain, shortness of breath, visual disturbance.  She is here to discuss weight loss.  She is particularly interested in GLP.  She does not feel like she have any barriers to injection therapy as she is not afraid of needles.  She has tried and failed multiple modalities of weight loss including Hydroxycut, over-the-counter weight loss supplements, Slim fast shakes, keto diet, lifestyle modification with diet and exercise and has even seen a bariatric clinic out on [address].  Unfortunately none of these regimens resulted in significant weight loss nor sustainable weight loss.  She is a prediabetic with last A1c up to 5.9.  This worries her quite a bit.   ROS: Per HPI  No Known Allergies Past Medical History:  Diagnosis Date   Allergy    Anemia    Anxiety    Back pain 04/1992   chronic back pain-diagnose from [place]   Depression    Headache    frequent headache   Hypertension    PTSD (post-traumatic stress disorder)    Thyroid disease    Uterine fibroid     Current Outpatient Medications:    Biotin w/ Vitamins C & E (HAIR/SKIN/NAILS PO), Take by mouth., Disp: , Rfl:    cetirizine (ZYRTEC) 10 MG tablet, Take 10 mg by mouth at bedtime., Disp: , Rfl:    Cholecalciferol (VITAMIN D) 2000 UNITS tablet, Take 4,000 Units by mouth at bedtime. , Disp: , Rfl:    FLUoxetine (PROZAC) 20 MG capsule, Take 1 capsule (20 mg total) by mouth daily. (NEEDS TO BE SEEN BEFORE NEXT REFILL), Disp: 90 capsule, Rfl: 0   ketoconazole (NIZORAL) 2 % shampoo, Apply 1 application topically as needed for irritation., Disp: , Rfl:    Ketotifen Fumarate (ALLERGY EYE DROPS OP), Apply 1 drop to eye daily as needed (Allergies).,  Disp: , Rfl:    metoprolol succinate (TOPROL-XL) 25 MG 24 hr tablet, Take 1 tablet (25 mg total) by mouth daily. (Patient taking differently: Take 12.5 mg by mouth daily.), Disp: 90 tablet, Rfl: 3   naproxen sodium (ALEVE) 220 MG tablet, Take 220 mg by mouth daily as needed (Back pain)., Disp: , Rfl:    tretinoin (RETIN-A) 0.05 % cream, Apply 1 application topically 3 (three) times a week. , Disp: , Rfl:    triamterene-hydrochlorothiazide (MAXZIDE) 75-50 MG tablet, Take 0.5 tablets by mouth daily., Disp: 45 tablet, Rfl: 1 Social History   Socioeconomic History   Marital status: Widowed    Spouse name: Not on file   Number of children: 1   Years of education: Not on file   Highest education level: Not on file  Occupational History   Occupation: Education officer, museum  Tobacco Use   Smoking status: Never   Smokeless tobacco: Never  Vaping Use   Vaping Use: Never used  Substance and Sexual Activity   Alcohol use: Not Currently    Comment: social-twice a year   Drug use: No   Sexual activity: Not on file  Other Topics Concern   Not on file  Social History Narrative   Son lives nearby and visits daily   Social Determinants of Health   Financial Resource Strain: Low Risk    Difficulty of Paying Living Expenses: Not hard at  all  Food Insecurity: No Food Insecurity   Worried About Charity fundraiser in the Last Year: Never true   Ran Out of Food in the Last Year: Never true  Transportation Needs: No Transportation Needs   Lack of Transportation (Medical): No   Lack of Transportation (Non-Medical): No  Physical Activity: Not on file  Stress: Not on file  Social Connections: Moderately Integrated   Frequency of Communication with Friends and Family: More than three times a week   Frequency of Social Gatherings with Friends and Family: More than three times a week   Attends Religious Services: More than 4 times per year   Active Member of [employer] or Organizations: Yes   Attends Theatre manager Meetings: More than 4 times per year   Marital Status: Widowed  Human resources officer Violence: Not At Risk   Fear of Current or Ex-Partner: No   Emotionally Abused: No   Physically Abused: No   Sexually Abused: No   Family History  Adopted: Yes  Problem Relation Age of Onset   Breast cancer Mother        and 2 other types but don't know what kind   Skin cancer Mother     Objective: Office vital signs reviewed. BP 130/89   Pulse 63   Temp 98.2 F (36.8 C)   Ht '5\' 9"'$  (1.753 m)   Wt 242 lb 12.8 oz (110.1 kg)   LMP 06/22/2016   SpO2 100%   BMI 35.86 kg/m   Physical Examination:  General: Awake, alert, well-appearing, nontoxic female.  Obese., No acute distress HEENT: Sclera white.  Moist mucous membranes Cardio: regular rate and rhythm, S1S2 heard, no murmurs appreciated Pulm: clear to auscultation bilaterally, no wheezes, rhonchi or rales; normal work of breathing on room air  Assessment/ Plan: 51 y.o. female   Morbid obesity ([place]) - Plan: Liraglutide -Weight Management (SAXENDA) 18 MG/3ML SOPN, Insulin Pen Needle (NOVOFINE PLUS PEN NEEDLE) 32G X 4 MM MISC  Prediabetes - Plan: Liraglutide -Weight Management (SAXENDA) 18 MG/3ML SOPN, Insulin Pen Needle (NOVOFINE PLUS PEN NEEDLE) 32G X 4 MM MISC  HTN (hypertension), benign - Plan: Liraglutide -Weight Management (SAXENDA) 18 MG/3ML SOPN, Insulin Pen Needle (NOVOFINE PLUS PEN NEEDLE) 32G X 4 MM MISC  I do not see any apparent contraindications to use of the GLP.  I am going to place her on Saxenda with ultimate plans to transition her over to [facility].  There unfortunately is a backorder on the lower doses of Wegovy and therefore that was not initiated today but I think that we might be able to transition her over to either of the 1 or 1.7 mg Wegovy once she achieves the 3 mg of the Saxenda.  We will plan to reconvene in 6 to 8 weeks.  We discussed need for 5% weight loss in 4 months which would be approximately 12 pounds  for this patient.  We discussed potential side effects and expected side effects of the medication including early satiety, possible nausea, acid reflux and constipation and/or diarrhea.  Handout was provided with tips on how to reduce risk of adverse side effects of the medication.  She is to drink plenty of water.  She will follow-up as needed or in 2 months as we discussed  No orders of the defined types were placed in this encounter.  No orders of the defined types were placed in this encounter.    Lisa Norlander, DO [place] [phone])  [phone]   

## 2021-11-28 ENCOUNTER — Telehealth: Payer: Self-pay | Admitting: Family Medicine

## 2021-11-28 ENCOUNTER — Ambulatory Visit: Payer: Medicare HMO

## 2021-11-28 NOTE — Telephone Encounter (Signed)
Patient wants to come in and have [place] do teaching with saxenda injection

## 2021-11-28 NOTE — Telephone Encounter (Signed)
Patient states that Kirke Shaggy is going to be 1400$ she would like to know if there are any other alternatives

## 2021-11-29 NOTE — Telephone Encounter (Signed)
Lm making pt aware

## 2021-11-29 NOTE — Telephone Encounter (Signed)
Unfortunately there are no alternatives other than Wegovy (which is the same cost and backordered by manufacturer currently).

## 2021-12-26 ENCOUNTER — Encounter: Payer: Self-pay | Admitting: Family Medicine

## 2021-12-26 ENCOUNTER — Ambulatory Visit (INDEPENDENT_AMBULATORY_CARE_PROVIDER_SITE_OTHER): Payer: Medicare HMO | Admitting: Family Medicine

## 2021-12-26 VITALS — BP 131/92 | HR 67 | Temp 96.6°F | Ht 69.0 in | Wt 244.6 lb

## 2021-12-26 DIAGNOSIS — R143 Flatulence: Secondary | ICD-10-CM

## 2021-12-26 DIAGNOSIS — R1011 Right upper quadrant pain: Secondary | ICD-10-CM

## 2021-12-26 DIAGNOSIS — E669 Obesity, unspecified: Secondary | ICD-10-CM | POA: Diagnosis not present

## 2021-12-26 DIAGNOSIS — B351 Tinea unguium: Secondary | ICD-10-CM | POA: Diagnosis not present

## 2021-12-26 DIAGNOSIS — M79676 Pain in unspecified toe(s): Secondary | ICD-10-CM | POA: Diagnosis not present

## 2021-12-26 NOTE — Patient Instructions (Signed)
GasX or [employer]

## 2021-12-26 NOTE — Progress Notes (Signed)
Assessment & Plan:  1. RUQ abdominal pain Resolved. Education provided on a gallbladder diet.  2. Flatus Encouraged to take GasX or Beano Gas.  3. Obesity (BMI 35.0-39.9 without comorbidity) Encouraged to go to [facility] and see if insurance covers [place] or Saxenda.    Follow up plan: Return if symptoms worsen or fail to improve.  Hendricks Limes, MSN, APRN, FNP-C [facility]  Subjective:   Patient ID: Lisa Boyer, female    DOB: February 12, 1971, 51 y.o.   MRN: [id number]  HPI: Lisa Boyer is a 51 y.o. female presenting on 12/26/2021 for Abdominal Pain and Gas (X 3 days)  Patient complains of abdominal pain. The pain is described as sore and swollen. Pain is located in the RUQ without radiation. Onset was 3 days ago. Symptoms have been gradually improving since. Associated symptoms: diarrhea and flatus. Diarrhea was two days ago. She reports she was able to eat yesterday. She was bloated yesterday but that went down with gas release throughout the night. States she is feeling much better and was going to cancel her appointment but thought she should still come to be safe.   ROS: Negative unless specifically indicated above in HPI.   Relevant past medical history reviewed and updated as indicated.   Allergies and medications reviewed and updated.   Current Outpatient Medications:    Biotin w/ Vitamins C & E (HAIR/SKIN/NAILS PO), Take by mouth., Disp: , Rfl:    cetirizine (ZYRTEC) 10 MG tablet, Take 10 mg by mouth at bedtime., Disp: , Rfl:    Cholecalciferol (VITAMIN D) 2000 UNITS tablet, Take 4,000 Units by mouth at bedtime. , Disp: , Rfl:    FLUoxetine (PROZAC) 20 MG capsule, Take 1 capsule (20 mg total) by mouth daily. (NEEDS TO BE SEEN BEFORE NEXT REFILL), Disp: 90 capsule, Rfl: 0   Insulin Pen Needle (NOVOFINE PLUS PEN NEEDLE) 32G X 4 MM MISC, UAD to inject Saxenda daily, Disp: 100 each, Rfl: 3   ketoconazole (NIZORAL) 2 % shampoo, Apply 1  application topically as needed for irritation., Disp: , Rfl:    Ketotifen Fumarate (ALLERGY EYE DROPS OP), Apply 1 drop to eye daily as needed (Allergies)., Disp: , Rfl:    Liraglutide -Weight Management (SAXENDA) 18 MG/3ML SOPN, Inject 0.6 mg into the skin daily for 7 days, THEN 1.2 mg daily for 7 days, THEN 1.8 mg daily for 7 days, THEN 2.4 mg daily for 7 days, THEN 3 mg daily., Disp: 15 mL, Rfl: 1   metoprolol succinate (TOPROL-XL) 25 MG 24 hr tablet, Take 1 tablet (25 mg total) by mouth daily. (Patient taking differently: Take 12.5 mg by mouth daily.), Disp: 90 tablet, Rfl: 3   naproxen sodium (ALEVE) 220 MG tablet, Take 220 mg by mouth daily as needed (Back pain)., Disp: , Rfl:    tretinoin (RETIN-A) 0.05 % cream, Apply 1 application topically 3 (three) times a week. , Disp: , Rfl:    triamterene-hydrochlorothiazide (MAXZIDE) 75-50 MG tablet, Take 0.5 tablets by mouth daily., Disp: 45 tablet, Rfl: 1  Allergies  Allergen Reactions   Latex Other (See Comments)    Discolor-stay for while Adhesive tape - leaves a mark on skin    Objective:   BP (!) 131/92   Pulse 67   Temp (!) 96.6 F (35.9 C) (Temporal)   Ht '5\' 9"'$  (1.753 m)   Wt 244 lb 9.6 oz (110.9 kg)   LMP 06/22/2016   SpO2 99%   BMI 36.12 kg/m  Physical Exam Vitals reviewed.  Constitutional:      General: She is not in acute distress.    Appearance: Normal appearance. She is not ill-appearing, toxic-appearing or diaphoretic.  HENT:     Head: Normocephalic and atraumatic.  Eyes:     General: No scleral icterus.       Right eye: No discharge.        Left eye: No discharge.     Conjunctiva/sclera: Conjunctivae normal.  Cardiovascular:     Rate and Rhythm: Normal rate.  Pulmonary:     Effort: Pulmonary effort is normal. No respiratory distress.  Abdominal:     General: Abdomen is flat. Bowel sounds are increased. There is no distension or abdominal bruit. There are no signs of injury.     Palpations: Abdomen is  soft. There is no shifting dullness, fluid wave, hepatomegaly, splenomegaly, mass or pulsatile mass.     Tenderness: There is abdominal tenderness in the epigastric area. Negative signs include Murphy's sign.  Musculoskeletal:        General: Normal range of motion.     Cervical back: Normal range of motion.  Skin:    General: Skin is warm and dry.     Capillary Refill: Capillary refill takes less than 2 seconds.  Neurological:     General: No focal deficit present.     Mental Status: She is alert and oriented to person, place, and time. Mental status is at baseline.  Psychiatric:        Mood and Affect: Mood normal.        Behavior: Behavior normal.        Thought Content: Thought content normal.        Judgment: Judgment normal.

## 2022-01-06 ENCOUNTER — Ambulatory Visit: Payer: Medicare HMO | Admitting: Family Medicine

## 2022-02-07 ENCOUNTER — Encounter: Payer: Self-pay | Admitting: Nurse Practitioner

## 2022-02-07 ENCOUNTER — Ambulatory Visit (INDEPENDENT_AMBULATORY_CARE_PROVIDER_SITE_OTHER): Payer: Medicare HMO | Admitting: Nurse Practitioner

## 2022-02-07 VITALS — BP 124/86 | HR 64 | Temp 98.7°F | Ht 69.0 in | Wt 246.0 lb

## 2022-02-07 DIAGNOSIS — B379 Candidiasis, unspecified: Secondary | ICD-10-CM | POA: Diagnosis not present

## 2022-02-07 DIAGNOSIS — J018 Other acute sinusitis: Secondary | ICD-10-CM

## 2022-02-07 DIAGNOSIS — T3695XA Adverse effect of unspecified systemic antibiotic, initial encounter: Secondary | ICD-10-CM | POA: Diagnosis not present

## 2022-02-07 MED ORDER — AMOXICILLIN-POT CLAVULANATE 875-125 MG PO TABS: 1.0000 | ORAL_TABLET | Freq: Two times a day (BID) | ORAL | 0 refills | Status: DC

## 2022-02-07 MED ORDER — FLUCONAZOLE 150 MG PO TABS: 150.0000 mg | ORAL_TABLET | Freq: Once | ORAL | 0 refills | Status: AC

## 2022-02-07 NOTE — Progress Notes (Signed)
Acute Office Visit  Subjective:     Patient ID: Lisa Boyer, female    DOB: Nov 28, 1970, 51 y.o.   MRN: [id number]  Chief Complaint  Patient presents with   Nasal Congestion    Tested 09/06 for covid was neg with home test     Sinusitis This is a new problem. Episode onset: In the past 2 to 3 days. The problem is unchanged. There has been no fever. The pain is moderate. Associated symptoms include congestion, headaches and sinus pressure. Pertinent negatives include no chills, coughing, diaphoresis, ear pain or sore throat. Past treatments include nothing.     Review of Systems  Constitutional: Negative.  Negative for chills, diaphoresis, fever, malaise/fatigue and weight loss.  HENT:  Positive for congestion, sinus pressure and sinus pain. Negative for ear pain and sore throat.   Respiratory:  Negative for cough.   Cardiovascular: Negative.   Skin: Negative.  Negative for itching and rash.  Neurological:  Positive for headaches.  All other systems reviewed and are negative.       Objective:    BP 124/86   Pulse 64   Temp 98.7 F (37.1 C)   Ht '5\' 9"'$  (4.098 m)   Wt 246 lb (111.6 kg)   LMP 06/22/2016   SpO2 100%   BMI 36.33 kg/m  BP Readings from Last 3 Encounters:  02/07/22 124/86  12/26/21 (!) 131/92  11/04/21 130/89   Wt Readings from Last 3 Encounters:  02/07/22 246 lb (111.6 kg)  12/26/21 244 lb 9.6 oz (110.9 kg)  11/04/21 242 lb 12.8 oz (110.1 kg)      Physical Exam Vitals and nursing note reviewed.  Constitutional:      Appearance: Normal appearance.  HENT:     Head: Normocephalic.     Right Ear: External ear normal.     Left Ear: External ear normal.     Nose: Congestion present.     Mouth/Throat:     Mouth: Mucous membranes are moist.     Pharynx: Oropharynx is clear.  Eyes:     Conjunctiva/sclera: Conjunctivae normal.  Cardiovascular:     Rate and Rhythm: Normal rate and regular rhythm.     Pulses: Normal pulses.     Heart sounds:  Normal heart sounds.  Pulmonary:     Effort: Pulmonary effort is normal.     Breath sounds: Normal breath sounds.  Abdominal:     General: Bowel sounds are normal.  Skin:    General: Skin is warm.  Neurological:     General: No focal deficit present.     Mental Status: She is alert and oriented to person, place, and time.  Psychiatric:        Behavior: Behavior normal.     No results found for any visits on 02/07/22.      Assessment & Plan:  Patient presents with sinusitis, at home COVID-19 test negative.  Symptoms present in the past 2 to 3 days.  Advised patient to Take meds as prescribed - Use a cool mist humidifier  -Use saline nose sprays frequently -Force fluids -For fever or aches or pains- take Tylenol or ibuprofen. -Augmentin 875-125 mg tablet by mouth daily. -If symptoms do not improve, she may need to be COVID tested to rule this out Follow up with worsening unresolved symptoms  Problem List Items Addressed This Visit   None Visit Diagnoses     Other subacute sinusitis    -  Primary   Relevant  Medications   amoxicillin-clavulanate (AUGMENTIN) 875-125 MG tablet   fluconazole (DIFLUCAN) 150 MG tablet   Antibiotic-induced yeast infection       Relevant Medications   fluconazole (DIFLUCAN) 150 MG tablet       Meds ordered this encounter  Medications   amoxicillin-clavulanate (AUGMENTIN) 875-125 MG tablet    Sig: Take 1 tablet by mouth 2 (two) times daily.    Dispense:  14 tablet    Refill:  0    Order Specific Question:   Supervising Provider    Answer:   Claretta Fraise [[id number]]   fluconazole (DIFLUCAN) 150 MG tablet    Sig: Take 1 tablet (150 mg total) by mouth once for 1 dose.    Dispense:  1 tablet    Refill:  0    Order Specific Question:   Supervising Provider    Answer:   Claretta Fraise [[id number]]    Return if symptoms worsen or fail to improve.  Ivy Lynn, NP

## 2022-02-07 NOTE — Patient Instructions (Signed)

## 2022-03-14 ENCOUNTER — Ambulatory Visit (INDEPENDENT_AMBULATORY_CARE_PROVIDER_SITE_OTHER): Payer: No Typology Code available for payment source | Admitting: Podiatry

## 2022-03-14 ENCOUNTER — Encounter: Payer: Self-pay | Admitting: Podiatry

## 2022-03-14 DIAGNOSIS — B351 Tinea unguium: Secondary | ICD-10-CM

## 2022-03-14 MED ORDER — EFINACONAZOLE 10 % EX SOLN: 1.0000 [drp] | Freq: Every day | CUTANEOUS | 11 refills | Status: DC

## 2022-03-14 NOTE — Patient Instructions (Signed)
Efinaconazole Topical Solution What is this medication? EFINACONAZOLE (e FEE na KON a zole) treats fungal infections of the nails. It belongs to a group of medications called antifungals. It will not treat infections caused by bacteria or viruses. This medicine may be used for other purposes; ask your health care provider or pharmacist if you have questions. COMMON BRAND NAME(S): JUBLIA What should I tell my care team before I take this medication? They need to know if you have any of these conditions: An unusual or allergic reaction to efinaconazole, other medications, foods, dyes or preservatives Pregnant or trying to get pregnant Breast-feeding How should I use this medication? This medication is for external use only. Do not take by mouth. Wash your hands before and after use. Do not get it in your eyes. If you do, rinse your eyes with plenty of cool tap water. Use it as directed on the prescription label. Do not use it more often than directed. Use the medication for the full course as directed by your care team, even if you think you are better. Do not stop using it unless your care team tells you to stop it early. This medication comes with INSTRUCTIONS FOR USE. Ask your pharmacist for directions on how to use this medication. Read the information carefully. Talk to your pharmacist or care team if you have questions. Talk to your care team about the use of this medication in children. While it may be prescribed for children as young as 6 years for selected conditions, precautions do apply. Overdosage: If you think you have taken too much of this medicine contact a poison control center or emergency room at once. NOTE: This medicine is only for you. Do not share this medicine with others. What if I miss a dose? If you miss a dose, use it as soon as you can. If it is almost time for your next dose, use only that dose. Do not use double or extra doses. What may interact with this  medication? Interactions are not expected. Do not use any other skin products on the same area of skin without talking to your care team. This list may not describe all possible interactions. Give your health care provider a list of all the medicines, herbs, non-prescription drugs, or dietary supplements you use. Also tell them if you smoke, drink alcohol, or use illegal drugs. Some items may interact with your medicine. What should I watch for while using this medication? Visit your care team for regular checks on your progress. It may be some time before you see the benefit from this medication. Do not use nail polish or other nail cosmetic products on the treated nails. What side effects may I notice from receiving this medication? Side effects that you should report to your care team as soon as possible: Allergic reactions--skin rash, itching, hives, swelling of the face, lips, tongue, or throat Side effects that usually do not require medical attention (report to your care team if they continue or are bothersome): Ingrown nails Mild skin irritation, redness, or dryness This list may not describe all possible side effects. Call your doctor for medical advice about side effects. You may report side effects to FDA at 1-800-FDA-1088. Where should I keep my medication? Keep out of the reach of children and pets. Store at room temperature between 20 and 25 degrees C (68 and 77 degrees F). Do not freeze. Keep the container tightly closed. Get rid of any unused medication after the expiration date.   This medication is flammable. Avoid exposure to heat, flame, and smoking. To get rid of medications that are no longer needed or have expired: Take the medication to a medication take-back program. Check with your pharmacy or law enforcement to find a location. If you cannot return the medication, ask your pharmacist or care team how to get rid of this medication safely. NOTE: This sheet is a summary. It  may not cover all possible information. If you have questions about this medicine, talk to your doctor, pharmacist, or health care provider.  2023 Elsevier/Gold Standard (2021-07-28 00:00:00)  

## 2022-03-15 NOTE — Progress Notes (Signed)
Subjective:   Patient ID: Lisa Boyer, female   DOB: 51 y.o.   MRN: [id number]   HPI Chief Complaint  Patient presents with   Nail Problem    Bilateral nail fungus, Seen by [facility],  Patient denies any pain, Dark color and left toe is lifting,     51 year old female with above concerns.  She has discoloration to both of her big toes.  On the right big toe the nail is somewhat split the tip of the nail nail on the right side has been removed previously no swelling redness or any drainage.  No recent treatment.   Review of Systems  All other systems reviewed and are negative.  Past Medical History:  Diagnosis Date   Allergy    Anemia    Anxiety    Back pain 04/1992   chronic back pain-diagnose from [place]   Depression    Headache    frequent headache   Hypertension    PTSD (post-traumatic stress disorder)    Thyroid disease    Uterine fibroid     Past Surgical History:  Procedure Laterality Date   ABDOMINAL HYSTERECTOMY  08/13/2017   left ovary removed   BREAST BIOPSY Bilateral 1888/1191/1994/2001   remove growth from both side breast   ENDOMETRIAL ABLATION     THYROIDECTOMY  08/1999   partial     Current Outpatient Medications:    Efinaconazole 10 % SOLN, Apply 1 drop topically daily., Disp: 4 mL, Rfl: 11   amoxicillin-clavulanate (AUGMENTIN) 875-125 MG tablet, Take 1 tablet by mouth 2 (two) times daily., Disp: 14 tablet, Rfl: 0   Biotin w/ Vitamins C & E (HAIR/SKIN/NAILS PO), Take by mouth., Disp: , Rfl:    cetirizine (ZYRTEC) 10 MG tablet, Take 10 mg by mouth at bedtime., Disp: , Rfl:    Cholecalciferol (VITAMIN D) 2000 UNITS tablet, Take 4,000 Units by mouth at bedtime. , Disp: , Rfl:    FLUoxetine (PROZAC) 20 MG capsule, Take 1 capsule (20 mg total) by mouth daily. (NEEDS TO BE SEEN BEFORE NEXT REFILL), Disp: 90 capsule, Rfl: 0   Insulin Pen Needle (NOVOFINE PLUS PEN NEEDLE) 32G X 4 MM MISC, UAD to inject Saxenda daily, Disp: 100 each, Rfl: 3   ketoconazole (NIZORAL) 2  % shampoo, Apply 1 application topically as needed for irritation., Disp: , Rfl:    Ketotifen Fumarate (ALLERGY EYE DROPS OP), Apply 1 drop to eye daily as needed (Allergies)., Disp: , Rfl:    Liraglutide -Weight Management (SAXENDA) 18 MG/3ML SOPN, Inject 0.6 mg into the skin daily for 7 days, THEN 1.2 mg daily for 7 days, THEN 1.8 mg daily for 7 days, THEN 2.4 mg daily for 7 days, THEN 3 mg daily., Disp: 15 mL, Rfl: 1   metoprolol succinate (TOPROL-XL) 25 MG 24 hr tablet, Take 1 tablet (25 mg total) by mouth daily. (Patient taking differently: Take 12.5 mg by mouth daily.), Disp: 90 tablet, Rfl: 3   naproxen sodium (ALEVE) 220 MG tablet, Take 220 mg by mouth daily as needed (Back pain)., Disp: , Rfl:    tretinoin (RETIN-A) 0.05 % cream, Apply 1 application topically 3 (three) times a week. , Disp: , Rfl:    triamterene-hydrochlorothiazide (MAXZIDE) 75-50 MG tablet, Take 0.5 tablets by mouth daily., Disp: 45 tablet, Rfl: 1  Allergies  Allergen Reactions   Latex Other (See Comments)    Discolor-stay for while Adhesive tape - leaves a mark on skin  Objective:  Physical Exam  General: AAO x3, NAD  Dermatological: Bilateral hallux, dystrophic and discolored.  Nails have white, yellow, brown discoloration.  Mild dark discoloration to both of the hallux toenails.  The right hallux nail is somewhat split distally.  There is no hyperpigmentation surrounding skin.  There is no pain in the nails there is no edema, erythema, drainage or pus.  Vascular: Dorsalis Pedis artery and Posterior Tibial artery pedal pulses are 2/4 bilateral with immedate capillary fill time. There is no pain with calf compression, swelling, warmth, erythema.   Neruologic: Grossly intact via light touch bilateral.  Musculoskeletal: No gross boney pedal deformities bilateral. No pain, crepitus, or limitation noted with foot and ankle range of motion bilateral. Muscular strength 5/5 in all groups tested  bilateral.  Gait: Unassisted, Nonantalgic.       Assessment:   51 year old female with onychomycosis     Plan:  -Treatment options discussed including all alternatives, risks, and complications -Etiology of symptoms were discussed -I do think she has nail fungus we discussed oral, topical as well as alternative treatments.  We will start [place] but still with results of the culture.  There is some hyperpigmentation to both the toenails and we will monitor this.  I took a picture of it but it did not save to the chart.  If there is any worsening or no improvement consider further biopsy.   Trula Slade DPM

## 2022-03-17 ENCOUNTER — Other Ambulatory Visit: Payer: Self-pay | Admitting: Podiatry

## 2022-03-17 ENCOUNTER — Telehealth: Payer: Self-pay | Admitting: Podiatry

## 2022-03-17 MED ORDER — CICLOPIROX 8 % EX SOLN
Freq: Every day | CUTANEOUS | 0 refills | Status: AC
Start: 2022-03-17 — End: ?

## 2022-03-17 NOTE — Telephone Encounter (Signed)
[facility] called and stated that the medication that was prescribed was not on the patients formulary and they was wondering if she could be prescribed an alternative?   Ciclopirox or Terbinafine   Please advise

## 2022-03-18 NOTE — Telephone Encounter (Signed)
Called patient back and let her know to hold off the the medications until Dr Jacqualyn Posey gets the test results back and then he will let her know that best options.

## 2022-03-18 NOTE — Telephone Encounter (Signed)
Left a detailed voicemail with information given from provider.

## 2022-03-18 NOTE — Telephone Encounter (Signed)
Patient called this morning is there another medication  she can use the [facility] states they do not have that medication ?

## 2022-03-25 ENCOUNTER — Other Ambulatory Visit: Payer: Self-pay | Admitting: Podiatry

## 2022-03-25 DIAGNOSIS — Z79899 Other long term (current) drug therapy: Secondary | ICD-10-CM

## 2022-03-25 NOTE — Telephone Encounter (Signed)
I have received the culture for the nail which does show fungus. We can do oral Lamisil which has the highest success rates, but we need to check blood work as this gets processed through the liver and there is a small risk of liver issues on the medication. If we do oral it is 90 days and we recheck blood work half way through. If she did not want to do oral, we can do topical which takes up to a year with daily applications.   It also shows some melanin pigment, which is likely benign but we need to make sure this does not progress.   Let me know what she would like to do.  Thanks!

## 2022-03-25 NOTE — Telephone Encounter (Signed)
Left voicemail for the patient to call the office back about results.

## 2022-03-25 NOTE — Telephone Encounter (Signed)
Patient states that she is willing try the oral Lamisil medication.

## 2022-03-26 NOTE — Telephone Encounter (Signed)
Left a detailed vm for the patient about getting her lab work done and where to go.

## 2022-03-27 DIAGNOSIS — Z79899 Other long term (current) drug therapy: Secondary | ICD-10-CM | POA: Diagnosis not present

## 2022-03-28 ENCOUNTER — Other Ambulatory Visit: Payer: Self-pay | Admitting: Podiatry

## 2022-03-28 DIAGNOSIS — Z79899 Other long term (current) drug therapy: Secondary | ICD-10-CM

## 2022-03-28 LAB — CBC WITH DIFFERENTIAL/PLATELET
Basophils Absolute: 0 10*3/uL (ref 0.0–0.2)
Basos: 1 %
EOS (ABSOLUTE): 0.1 10*3/uL (ref 0.0–0.4)
Eos: 3 %
Hematocrit: 38.3 % (ref 34.0–46.6)
Hemoglobin: 13 g/dL (ref 11.1–15.9)
Immature Grans (Abs): 0 10*3/uL (ref 0.0–0.1)
Immature Granulocytes: 0 %
Lymphocytes Absolute: 1.7 10*3/uL (ref 0.7–3.1)
Lymphs: 40 %
MCH: 27.8 pg (ref 26.6–33.0)
MCHC: 33.9 g/dL (ref 31.5–35.7)
MCV: 82 fL (ref 79–97)
Monocytes Absolute: 0.4 10*3/uL (ref 0.1–0.9)
Monocytes: 9 %
Neutrophils Absolute: 2.1 10*3/uL (ref 1.4–7.0)
Neutrophils: 47 %
Platelets: 195 10*3/uL (ref 150–450)
RBC: 4.68 x10E6/uL (ref 3.77–5.28)
RDW: 13.3 % (ref 11.7–15.4)
WBC: 4.4 10*3/uL (ref 3.4–10.8)

## 2022-03-28 LAB — HEPATIC FUNCTION PANEL
ALT: 21 IU/L (ref 0–32)
AST: 23 IU/L (ref 0–40)
Albumin: 4.3 g/dL (ref 3.8–4.9)
Alkaline Phosphatase: 71 IU/L (ref 44–121)
Bilirubin Total: 0.3 mg/dL (ref 0.0–1.2)
Bilirubin, Direct: <0.1 mg/dL (ref 0.00–0.40)
Total Protein: 7.3 g/dL (ref 6.0–8.5)

## 2022-03-28 MED ORDER — TERBINAFINE HCL 250 MG PO TABS: 250.0000 mg | ORAL_TABLET | Freq: Every day | ORAL | 0 refills | Status: DC

## 2022-04-04 ENCOUNTER — Ambulatory Visit: Payer: Medicare HMO | Admitting: Family Medicine

## 2022-04-09 ENCOUNTER — Encounter: Payer: Self-pay | Admitting: Podiatry

## 2022-05-30 ENCOUNTER — Encounter: Payer: Self-pay | Admitting: Family Medicine

## 2022-05-30 ENCOUNTER — Ambulatory Visit (INDEPENDENT_AMBULATORY_CARE_PROVIDER_SITE_OTHER): Payer: Medicare HMO | Admitting: Family Medicine

## 2022-05-30 VITALS — BP 121/83 | HR 71 | Temp 98.0°F | Ht 69.0 in | Wt 251.0 lb

## 2022-05-30 DIAGNOSIS — B349 Viral infection, unspecified: Secondary | ICD-10-CM

## 2022-05-30 NOTE — Progress Notes (Signed)
   Acute Office Visit  Subjective:     Patient ID: Lisa Boyer, female    DOB: [birth year], 51 y.o.   MRN: [id number]  Chief Complaint  Patient presents with   Anorexia   Diarrhea   Nausea    Diarrhea    Patient is in today for decreased appetite, diarrhea, and nausea for 2 days. She denies vomiting, fever, or abdominal pain. She also has had some congestion, postnasal drip, and fatigue. She denies sore throat, shortness of breath, chest pain, or ear pain. She has been taking imodium and sudafed. She reports that her symptoms have now significant improve. Starting to feel a little hungry today and had a normal BM today.     Review of Systems  Gastrointestinal:  Positive for diarrhea.   As per HPI.     Objective:    BP 121/83   Pulse 71   Temp 98 F (36.7 C)   Ht '5\' 9"'$  (1.753 m)   Wt 251 lb (113.9 kg)   LMP 06/22/2016   SpO2 97%   BMI 37.07 kg/m    Physical Exam Vitals and nursing note reviewed.  Constitutional:      General: She is not in acute distress.    Appearance: Normal appearance. She is not ill-appearing, toxic-appearing or diaphoretic.  HENT:     Head: Normocephalic and atraumatic.     Right Ear: Tympanic membrane, ear canal and external ear normal.     Left Ear: Tympanic membrane, ear canal and external ear normal.     Nose: Congestion present.     Mouth/Throat:     Mouth: Mucous membranes are moist.     Pharynx: Oropharynx is clear. No oropharyngeal exudate or posterior oropharyngeal erythema.  Eyes:     General:        Right eye: No discharge.        Left eye: No discharge.     Conjunctiva/sclera: Conjunctivae normal.  Cardiovascular:     Rate and Rhythm: Normal rate.     Heart sounds: Normal heart sounds. No murmur heard. Pulmonary:     Effort: Pulmonary effort is normal.     Breath sounds: Normal breath sounds.  Abdominal:     General: Bowel sounds are normal. There is no distension.     Palpations: Abdomen is soft.     Tenderness:  There is no abdominal tenderness. There is no guarding or rebound.  Musculoskeletal:     Cervical back: Neck supple. No rigidity.     Right lower leg: No edema.     Left lower leg: No edema.  Lymphadenopathy:     Cervical: No cervical adenopathy.  Skin:    General: Skin is warm and dry.  Neurological:     General: No focal deficit present.     Mental Status: She is alert.  Psychiatric:        Mood and Affect: Mood normal.        Behavior: Behavior normal.     No results found for any visits on 05/30/22.      Assessment & Plan:   Lisa Boyer was seen today for anorexia, diarrhea and nausea.  Diagnoses and all orders for this visit:  Viral illness Symptoms now nearly resolved. Benign exam. Discussed symptomatic care and return precautions.   The patient indicates understanding of these issues and agrees with the plan.   Gwenlyn Perking, FNP

## 2022-06-09 ENCOUNTER — Ambulatory Visit (INDEPENDENT_AMBULATORY_CARE_PROVIDER_SITE_OTHER): Payer: Medicare HMO | Admitting: Family Medicine

## 2022-06-09 ENCOUNTER — Encounter: Payer: Self-pay | Admitting: Family Medicine

## 2022-06-09 VITALS — BP 129/89 | HR 82 | Temp 98.1°F | Ht 69.0 in | Wt 254.8 lb

## 2022-06-09 DIAGNOSIS — F4323 Adjustment disorder with mixed anxiety and depressed mood: Secondary | ICD-10-CM | POA: Diagnosis not present

## 2022-06-09 DIAGNOSIS — I10 Essential (primary) hypertension: Secondary | ICD-10-CM | POA: Diagnosis not present

## 2022-06-09 DIAGNOSIS — R5383 Other fatigue: Secondary | ICD-10-CM | POA: Diagnosis not present

## 2022-06-09 DIAGNOSIS — F4310 Post-traumatic stress disorder, unspecified: Secondary | ICD-10-CM | POA: Diagnosis not present

## 2022-06-09 DIAGNOSIS — Z6837 Body mass index (BMI) 37.0-37.9, adult: Secondary | ICD-10-CM

## 2022-06-09 LAB — BAYER DCA HB A1C WAIVED: HB A1C (BAYER DCA - WAIVED): 6.6 % — ABNORMAL HIGH (ref 4.8–5.6)

## 2022-06-09 NOTE — Progress Notes (Signed)
Subjective: CC: Follow-up anxiety, obesity PCP: Janora Norlander, DO ACZ:YSAYTK Lisa Boyer is a 52 y.o. female presenting to clinic today for:  1.  Morbid obesity Patient admits that she has not had much success with weight loss since her last visit.  She was not able to afford the GLP that was prescribed.  She has been working out a couple of days per week with a Clinical research associate.  She has missed the last couple of sessions due to being ill with an upper respiratory infection but she is improving from that standpoint.  She does note some lingering fatigue however.   She also admits that she has not been taking the Prozac because she thought that was probably hindering her ability to lose weight but she in fact has gained weight since discontinuing it and has noticed a rise in her anxiety symptoms.  She plans on "restarting today".  She has plenty of pills at home and does not need a refill.  She is still interested in medical weight loss and would like to discuss options today.   ROS: Per HPI  Allergies  Allergen Reactions   Latex Other (See Comments)    Discolor-stay for while Adhesive tape - leaves a mark on skin   Past Medical History:  Diagnosis Date   Allergy    Anemia    Anxiety    Back pain 04/1992   chronic back pain-diagnose from [place]   Depression    Headache    frequent headache   Hypertension    PTSD (post-traumatic stress disorder)    Thyroid disease    Uterine fibroid     Current Outpatient Medications:    Biotin w/ Vitamins C & E (HAIR/SKIN/NAILS PO), Take by mouth., Disp: , Rfl:    cetirizine (ZYRTEC) 10 MG tablet, Take 10 mg by mouth at bedtime., Disp: , Rfl:    Cholecalciferol (VITAMIN D) 2000 UNITS tablet, Take 4,000 Units by mouth at bedtime. , Disp: , Rfl:    ciclopirox (PENLAC) 8 % solution, Apply topically at bedtime. Apply over nail and surrounding skin. Apply daily over previous coat. After seven (7) days, may remove with alcohol and continue cycle., Disp: 6.6  mL, Rfl: 0   Insulin Pen Needle (NOVOFINE PLUS PEN NEEDLE) 32G X 4 MM MISC, UAD to inject Saxenda daily, Disp: 100 each, Rfl: 3   ketoconazole (NIZORAL) 2 % shampoo, Apply 1 application topically as needed for irritation., Disp: , Rfl:    metoprolol succinate (TOPROL-XL) 25 MG 24 hr tablet, Take 1 tablet (25 mg total) by mouth daily. (Patient taking differently: Take 12.5 mg by mouth daily.), Disp: 90 tablet, Rfl: 3   naproxen sodium (ALEVE) 220 MG tablet, Take 220 mg by mouth daily as needed (Back pain)., Disp: , Rfl:    tretinoin (RETIN-A) 0.05 % cream, Apply 1 application topically 3 (three) times a week. , Disp: , Rfl:    triamterene-hydrochlorothiazide (MAXZIDE) 75-50 MG tablet, Take 0.5 tablets by mouth daily., Disp: 45 tablet, Rfl: 1   FLUoxetine (PROZAC) 20 MG capsule, Take 1 capsule (20 mg total) by mouth daily. (NEEDS TO BE SEEN BEFORE NEXT REFILL) (Patient not taking: Reported on 06/09/2022), Disp: 90 capsule, Rfl: 0 Social History   Socioeconomic History   Marital status: Widowed    Spouse name: Not on file   Number of children: 1   Years of education: Not on file   Highest education level: Not on file  Occupational History   Occupation: Education officer, museum  Tobacco Use   Smoking status: Never   Smokeless tobacco: Never  Vaping Use   Vaping Use: Never used  Substance and Sexual Activity   Alcohol use: Not Currently    Comment: social-twice a year   Drug use: No   Sexual activity: Not on file  Other Topics Concern   Not on file  Social History Narrative   Son lives nearby and visits daily   Social Determinants of Health   Financial Resource Strain: Low Risk  (04/30/2021)   Overall Financial Resource Strain (CARDIA)    Difficulty of Paying Living Expenses: Not hard at all  Food Insecurity: No Food Insecurity (04/30/2021)   Hunger Vital Sign    Worried About Running Out of Food in the Last Year: Never true    [place] in the Last Year: Never true  Transportation  Needs: No Transportation Needs (04/30/2021)   PRAPARE - Hydrologist (Medical): No    Lack of Transportation (Non-Medical): No  Physical Activity: Not on file  Stress: Not on file  Social Connections: Moderately Integrated (04/30/2021)   Social Connection and Isolation Panel [NHANES]    Frequency of Communication with Friends and Family: More than three times a week    Frequency of Social Gatherings with Friends and Family: More than three times a week    Attends Religious Services: More than 4 times per year    Active Member of [employer] or Organizations: Yes    Attends Archivist Meetings: More than 4 times per year    Marital Status: Widowed  Intimate Partner Violence: Not At Risk (04/30/2021)   Humiliation, Afraid, Rape, and Kick questionnaire    Fear of Current or Ex-Partner: No    Emotionally Abused: No    Physically Abused: No    Sexually Abused: No   Family History  Adopted: Yes  Problem Relation Age of Onset   Breast cancer Mother        and 2 other types but don't know what kind   Skin cancer Mother     Objective: Office vital signs reviewed. BP 129/89   Pulse 82   Temp 98.1 F (36.7 C)   Ht '5\' 9"'$  (1.753 m)   Wt 254 lb 12.8 oz (115.6 kg)   LMP 06/22/2016   SpO2 99%   BMI 37.63 kg/m   Physical Examination:  General: Awake, alert, obese, No acute distress HEENT: sclera white, MMM Cardio: regular rate and rhythm, S1S2 heard, no murmurs appreciated Pulm: clear to auscultation bilaterally, no wheezes, rhonchi or rales; normal work of breathing on room air Psych: Anxious but very pleasant, interactive.  Thought processes linear  Assessment/ Plan: 52 y.o. female   HTN (hypertension), benign - Plan: CMP14+EGFR  Adjustment disorder with mixed anxiety and depressed mood  PTSD (post-traumatic stress disorder) - Plan: CMP14+EGFR  Morbid obesity (HCC) - Plan: CMP14+EGFR, TSH, Bayer DCA Hb A1c Waived, T4, Free  Other fatigue -  Plan: CMP14+EGFR, TSH, T4, Free, CBC, Vitamin B12  Blood pressure is controlled.  No changes.  I gave her information on the semaglutide compounded with B12.  She will let me know if this is something she wishes to pursue.  No apparent contraindications to use of the medicine.  I certainly agree with her restarting Prozac.  She has plenty of refills so I did not send this in today.  She may follow-up with me in the next few months for physical, recheck of mood  and weight.  In the meantime, nonfasting labs were collected today  No orders of the defined types were placed in this encounter.  No orders of the defined types were placed in this encounter.    Janora Norlander, DO [place] [phone]

## 2022-06-10 ENCOUNTER — Other Ambulatory Visit: Payer: Self-pay | Admitting: Family Medicine

## 2022-06-10 DIAGNOSIS — E119 Type 2 diabetes mellitus without complications: Secondary | ICD-10-CM

## 2022-06-10 LAB — CMP14+EGFR
ALT: 19 IU/L (ref 0–32)
AST: 16 IU/L (ref 0–40)
Albumin/Globulin Ratio: 1.3 (ref 1.2–2.2)
Albumin: 4 g/dL (ref 3.8–4.9)
Alkaline Phosphatase: 68 IU/L (ref 44–121)
BUN/Creatinine Ratio: 11 (ref 9–23)
BUN: 8 mg/dL (ref 6–24)
Bilirubin Total: 0.3 mg/dL (ref 0.0–1.2)
CO2: 26 mmol/L (ref 20–29)
Calcium: 9.4 mg/dL (ref 8.7–10.2)
Chloride: 100 mmol/L (ref 96–106)
Creatinine, Ser: 0.71 mg/dL (ref 0.57–1.00)
Globulin, Total: 3 g/dL (ref 1.5–4.5)
Glucose: 87 mg/dL (ref 70–99)
Potassium: 4.1 mmol/L (ref 3.5–5.2)
Sodium: 139 mmol/L (ref 134–144)
Total Protein: 7 g/dL (ref 6.0–8.5)
eGFR: 102 mL/min/{1.73_m2} (ref >59)

## 2022-06-10 LAB — CBC
Hematocrit: 41.2 % (ref 34.0–46.6)
Hemoglobin: 13.7 g/dL (ref 11.1–15.9)
MCH: 27.7 pg (ref 26.6–33.0)
MCHC: 33.3 g/dL (ref 31.5–35.7)
MCV: 83 fL (ref 79–97)
Platelets: 260 10*3/uL (ref 150–450)
RBC: 4.94 x10E6/uL (ref 3.77–5.28)
RDW: 13.1 % (ref 11.7–15.4)
WBC: 4.9 10*3/uL (ref 3.4–10.8)

## 2022-06-10 LAB — VITAMIN B12: Vitamin B-12: 1001 pg/mL (ref 232–1245)

## 2022-06-10 LAB — TSH: TSH: 1.47 u[IU]/mL (ref 0.450–4.500)

## 2022-06-10 LAB — T4, FREE: Free T4: 1.18 ng/dL (ref 0.82–1.77)

## 2022-06-10 MED ORDER — OZEMPIC (0.25 OR 0.5 MG/DOSE) 2 MG/3ML ~~LOC~~ SOPN: PEN_INJECTOR | SUBCUTANEOUS | 0 refills | Status: AC

## 2022-06-17 ENCOUNTER — Telehealth: Payer: Self-pay | Admitting: Family Medicine

## 2022-06-17 MED ORDER — ONDANSETRON 4 MG PO TBDP: 4.0000 mg | ORAL_TABLET | Freq: Three times a day (TID) | ORAL | 0 refills | Status: DC | PRN

## 2022-06-17 MED ORDER — POLYETHYLENE GLYCOL 3350 17 GM/SCOOP PO POWD: ORAL | 1 refills | Status: AC

## 2022-06-17 NOTE — Telephone Encounter (Signed)
Pt is asking for zofran to help with nausea while on taking ozempic.  She also wants to take know what she can take to help with her bowel movements. Use [facility] pharmacy. Please call back

## 2022-06-17 NOTE — Telephone Encounter (Signed)
Zofran and miralax sent

## 2022-06-17 NOTE — Telephone Encounter (Signed)
Pt aware.

## 2022-06-18 ENCOUNTER — Telehealth: Payer: Self-pay | Admitting: *Deleted

## 2022-06-18 NOTE — Telephone Encounter (Signed)
Patient is calling to ask if she can switch from fungal medication currently taking to laser treatments, will discuss at tomorrow's visit. Blood sugar levels are very high now, has been prescribed on an injection for that, afraid of taking the two medicines together.

## 2022-06-19 ENCOUNTER — Ambulatory Visit (INDEPENDENT_AMBULATORY_CARE_PROVIDER_SITE_OTHER): Payer: No Typology Code available for payment source | Admitting: Podiatry

## 2022-06-19 DIAGNOSIS — L989 Disorder of the skin and subcutaneous tissue, unspecified: Secondary | ICD-10-CM | POA: Diagnosis not present

## 2022-06-19 DIAGNOSIS — B351 Tinea unguium: Secondary | ICD-10-CM

## 2022-06-19 DIAGNOSIS — L819 Disorder of pigmentation, unspecified: Secondary | ICD-10-CM | POA: Diagnosis not present

## 2022-06-19 DIAGNOSIS — L814 Other melanin hyperpigmentation: Secondary | ICD-10-CM | POA: Diagnosis not present

## 2022-06-19 NOTE — Patient Instructions (Signed)

## 2022-06-19 NOTE — Progress Notes (Signed)
Subjective:  Chief Complaint  Patient presents with   Foot Problem    Patient here for foot fungus, right foot, prior treatment lamasil, patient request laser treatment    52 year old female presents the office.  No concerns.  She has been doing lifestyle any side effects but is interested in other treatments versus laser.  Denies any drainage or pus or any significant pain at this time.  No open lesions.  Objective: AAO x3, NAD DP/PT pulses palpable bilaterally, CRT less than 3 seconds Skin to be hypertrophic disorder yellow discoloration.  On the left also there is some slightly darker discoloration diffusely over the distal portion of the nail on the right hallux along the medial nail border is a trucker longitudinal streak present in the nail.  No edema, erythema.  No signs of infection. No pain with calf compression, swelling, warmth, erythema    Assessment: 52 year old female onychomycosis; hyperpigmented lesion on right hallux  Plan: -All treatment options discussed with the patient including all alternatives, risks, complications.  -Given the discoloration of the right hallux I recommended removal, biopsy of this lesion to rule out other pathologies.  I discussed this with the patient and she agrees to proceed.  I cleaned the skin with alcohol mixture of lidocaine, Marcaine plain for total of 3 mL was infiltrated in all digital block fashion.  Anesthetized the skin was prepped with Betadine.  Tourniquet applied.  I utilized a 4 mm punch biopsy to take a piece of the nail, epithelium and I also excised the lateral portion of the nail.  This was sent to pathology for biopsy of the nail.  I irrigated with saline hemostasis was achieved.  Silvadene was applied followed by dressing.  Tourniquet was released. There was an immediate cap refill to the digit.  She tolerated the procedure well any complications. -Patient encouraged to call the office with any questions, concerns, change in  symptoms.   Trula Slade DPM

## 2022-06-27 ENCOUNTER — Telehealth: Payer: Self-pay | Admitting: Family Medicine

## 2022-06-27 NOTE — Telephone Encounter (Signed)
Left message for patient to call back and schedule Medicare Annual Wellness Visit (AWV) to be completed by video or phone.   Last AWV: 04/30/2021    Please schedule at anytime with [place]     Any questions, please contact me at [phone]   Thank you,   [facility] Clinical Support for [place] [facility] ??[phone] or ??[phone]

## 2022-06-27 NOTE — Telephone Encounter (Signed)
R/C to [place].

## 2022-07-01 ENCOUNTER — Other Ambulatory Visit: Payer: Self-pay

## 2022-07-01 DIAGNOSIS — B351 Tinea unguium: Secondary | ICD-10-CM

## 2022-07-03 ENCOUNTER — Telehealth: Payer: Self-pay | Admitting: Podiatry

## 2022-07-03 NOTE — Telephone Encounter (Signed)
Patient called back she wants to discuss possibly doing laser treatment for the fungus she is going to check with the [place] and see if they will help cover it.  The [facility] said that the oral medication could possibly make her sick because of the blood pressure medication she is on. She said she will go over all of this in her appointment Tuesday but would like a call back at your convenience

## 2022-07-04 ENCOUNTER — Encounter: Payer: Self-pay | Admitting: Family Medicine

## 2022-07-08 ENCOUNTER — Ambulatory Visit (INDEPENDENT_AMBULATORY_CARE_PROVIDER_SITE_OTHER): Payer: No Typology Code available for payment source | Admitting: Podiatry

## 2022-07-08 ENCOUNTER — Encounter: Payer: Self-pay | Admitting: Podiatry

## 2022-07-08 DIAGNOSIS — B351 Tinea unguium: Secondary | ICD-10-CM | POA: Diagnosis not present

## 2022-07-08 DIAGNOSIS — L819 Disorder of pigmentation, unspecified: Secondary | ICD-10-CM

## 2022-07-08 MED ORDER — EFINACONAZOLE 10 % EX SOLN: 1.0000 [drp] | Freq: Every day | CUTANEOUS | 11 refills | Status: AC

## 2022-07-08 NOTE — Progress Notes (Signed)
Subjective: Chief Complaint  Patient presents with   Results    Nail check, right foot, biopsy results    52 year old female presents the office with above concerns.  She states that procedure has healed well she did have any issues with this.  No swelling redness or any drainage.  No open lesions that she reports.  She is interested in laser therapy for nail fungus.  Objective: AAO x3, NAD DP/PT pulses palpable bilaterally, CRT less than 3 seconds On the right side the procedure site appears to have healed well.  There is no edema, erythema or signs of infection.  No open lesions.  Hallux nails mostly remain hypertrophic, dystrophic with yellow, brown discoloration.  No edema, erythema or signs of infection of the toenail sites. No pain with calf compression, swelling, warmth, erythema  Assessment: Onychomycosis; likely benign pigment change  Plan: -All treatment options discussed with the patient including all alternatives, risks, complications.  -Again reviewed the biopsy results.  Will start treatment for onychomycosis.  She was hold off on oral medication given her other medical conditions.  Prescribe Jublia.  She would like to also try laser, we will try to get this covered through the [place] at her request. -Patient encouraged to call the office with any questions, concerns, change in symptoms.    Trula Slade DPM

## 2022-07-14 ENCOUNTER — Other Ambulatory Visit: Payer: Self-pay | Admitting: Podiatry

## 2022-07-14 ENCOUNTER — Telehealth: Payer: Self-pay | Admitting: Podiatry

## 2022-07-14 MED ORDER — CLOTRIMAZOLE 1 % EX CREA: 1.0000 | TOPICAL_CREAM | Freq: Two times a day (BID) | CUTANEOUS | 0 refills | Status: DC

## 2022-07-14 NOTE — Telephone Encounter (Signed)
Please let the patient know they have denied the Jublia and gave me a list of medications we have to try first. I have sent clotrimazole cream to the [place] pharmacy for her. If this does not help we can try again. Thanks!

## 2022-07-15 NOTE — Telephone Encounter (Signed)
Left a detailed message for the patient regarding her insurance denying the Jublia medication and that Dr. Jacqualyn Posey has sent over an alternative medication that was on her insurance preferred list. New medication was sent to the [place].

## 2022-07-25 ENCOUNTER — Ambulatory Visit (INDEPENDENT_AMBULATORY_CARE_PROVIDER_SITE_OTHER): Payer: No Typology Code available for payment source | Admitting: *Deleted

## 2022-07-25 ENCOUNTER — Encounter: Payer: Self-pay | Admitting: Podiatry

## 2022-07-25 DIAGNOSIS — B351 Tinea unguium: Secondary | ICD-10-CM

## 2022-07-25 NOTE — Patient Instructions (Signed)

## 2022-07-25 NOTE — Progress Notes (Signed)
Patient presents today for the 1st laser treatment. Diagnosed with mycotic nail infection by Dr. Jacqualyn Posey.   Toenail most affected 1st and 5th toenails.  All other systems are negative.  Nails were filed thin. Laser therapy was administered to 1st and 5th toenails bilateral and patient tolerated the treatment well. All safety precautions were in place.    Follow up in 4 weeks for laser # 2.

## 2022-08-05 ENCOUNTER — Telehealth: Payer: Self-pay | Admitting: *Deleted

## 2022-08-05 MED ORDER — CLOTRIMAZOLE 1 % EX CREA: 1.0000 | TOPICAL_CREAM | Freq: Two times a day (BID) | CUTANEOUS | 0 refills | Status: DC

## 2022-08-05 NOTE — Telephone Encounter (Signed)
Patient is calling to ask if the prescription for Clotrimazole 1% cream be [facility] to pharmacy.

## 2022-08-06 ENCOUNTER — Other Ambulatory Visit: Payer: Self-pay | Admitting: Podiatry

## 2022-08-06 MED ORDER — CLOTRIMAZOLE 1 % EX CREA: 1.0000 | TOPICAL_CREAM | Freq: Two times a day (BID) | CUTANEOUS | 0 refills | Status: AC

## 2022-08-08 ENCOUNTER — Telehealth: Payer: Self-pay | Admitting: *Deleted

## 2022-08-08 NOTE — Telephone Encounter (Signed)
Patient updated thru voice message. 

## 2022-08-22 ENCOUNTER — Ambulatory Visit (INDEPENDENT_AMBULATORY_CARE_PROVIDER_SITE_OTHER): Payer: No Typology Code available for payment source | Admitting: *Deleted

## 2022-08-22 DIAGNOSIS — B351 Tinea unguium: Secondary | ICD-10-CM

## 2022-08-22 NOTE — Progress Notes (Signed)
Patient presents today for the 2nd laser treatment. Diagnosed with mycotic nail infection by Dr. Jacqualyn Posey.   Toenail most affected 1st and 5th toenails.  All other systems are negative.  Nails were filed thin. Laser therapy was administered to 1st and 5th toenails bilateral and patient tolerated the treatment well. All safety precautions were in place.    Follow up in 4 weeks for laser # 3.

## 2022-09-08 ENCOUNTER — Ambulatory Visit (INDEPENDENT_AMBULATORY_CARE_PROVIDER_SITE_OTHER): Payer: Medicare HMO | Admitting: Family Medicine

## 2022-09-08 ENCOUNTER — Encounter: Payer: Self-pay | Admitting: Family Medicine

## 2022-09-08 VITALS — BP 128/89 | HR 71 | Temp 98.6°F | Ht 69.0 in | Wt 239.0 lb

## 2022-09-08 DIAGNOSIS — I152 Hypertension secondary to endocrine disorders: Secondary | ICD-10-CM | POA: Diagnosis not present

## 2022-09-08 DIAGNOSIS — E119 Type 2 diabetes mellitus without complications: Secondary | ICD-10-CM

## 2022-09-08 DIAGNOSIS — Z7985 Long-term (current) use of injectable non-insulin antidiabetic drugs: Secondary | ICD-10-CM | POA: Diagnosis not present

## 2022-09-08 DIAGNOSIS — E1159 Type 2 diabetes mellitus with other circulatory complications: Secondary | ICD-10-CM

## 2022-09-08 DIAGNOSIS — Z6835 Body mass index (BMI) 35.0-35.9, adult: Secondary | ICD-10-CM | POA: Diagnosis not present

## 2022-09-08 LAB — BAYER DCA HB A1C WAIVED: HB A1C (BAYER DCA - WAIVED): 6 % — ABNORMAL HIGH (ref 4.8–5.6)

## 2022-09-08 MED ORDER — OZEMPIC (0.25 OR 0.5 MG/DOSE) 2 MG/3ML ~~LOC~~ SOPN: 0.5000 mg | PEN_INJECTOR | SUBCUTANEOUS | 3 refills | Status: DC

## 2022-09-08 NOTE — Progress Notes (Signed)
Subjective: CC:DM PCP: Lisa Ip, DO PPJ:Lisa Boyer is a 52 y.o. female presenting to clinic today for:  1. Type 2 Diabetes with hypertension:  She reports compliance with Ozempic and is tolerating the medication without difficulty now.  She has been able to figure out which foods trigger the nausea and GI issues and she is no longer having this problem.  She has been very physically active and is working out at [employer]  She feels energized and the best that she has in a really long time.  She even feels like maybe this is helping from mental and emotional health standpoint.  She reports to me that she did break-up with her boyfriend about 2 weeks ago but she seems to be navigating this without difficulty.  The Prozac is really helping and she is compliant with that medication.  Last eye exam: needs Last foot exam: needs Last A1c:  Lab Results  Component Value Date   HGBA1C 6.6 (H) 06/09/2022   Nephropathy screen indicated?: needs Last flu, zoster and/or pneumovax:  Immunization History  Administered Date(s) Administered   Influenza,inj,Quad PF,6+ Mos 04/15/2021   Influenza-Unspecified 06/10/2020   PFIZER(Purple Top)SARS-COV-2 Vaccination 07/30/2019, 08/20/2019   Zoster Recombinat (Shingrix) 04/06/2020, 09/26/2020    ROS: No chest pain, shortness of breath, visual disturbance reported.    ROS: Per HPI  Allergies  Allergen Reactions   Latex Other (See Comments)    Discolor-stay for while Adhesive tape - leaves a mark on skin   Past Medical History:  Diagnosis Date   Allergy    Anemia    Anxiety    Back pain 04/1992   chronic back pain-diagnose from [place]   Depression    Headache    frequent headache   Hypertension    PTSD (post-traumatic stress disorder)    Thyroid disease    Uterine fibroid     Current Outpatient Medications:    OZEMPIC, 0.25 OR 0.5 MG/DOSE, 2 MG/3ML SOPN, Inject 0.5 mg into the skin every 7 (seven) days., Disp: , Rfl:    Biotin  w/ Vitamins C & E (HAIR/SKIN/NAILS PO), Take by mouth., Disp: , Rfl:    cetirizine (ZYRTEC) 10 MG tablet, Take 10 mg by mouth at bedtime., Disp: , Rfl:    Cholecalciferol (VITAMIN D) 2000 UNITS tablet, Take 4,000 Units by mouth at bedtime. , Disp: , Rfl:    ciclopirox (PENLAC) 8 % solution, Apply topically at bedtime. Apply over nail and surrounding skin. Apply daily over previous coat. After seven (7) days, may remove with alcohol and continue cycle., Disp: 6.6 mL, Rfl: 0   clotrimazole (LOTRIMIN) 1 % cream, Apply 1 Application topically 2 (two) times daily., Disp: 30 g, Rfl: 0   Efinaconazole 10 % SOLN, Apply 1 drop topically daily., Disp: 4 mL, Rfl: 11   FLUoxetine (PROZAC) 20 MG capsule, Take 1 capsule (20 mg total) by mouth daily. (NEEDS TO BE SEEN BEFORE NEXT REFILL), Disp: 90 capsule, Rfl: 0   Insulin Pen Needle (NOVOFINE PLUS PEN NEEDLE) 32G X 4 MM MISC, UAD to inject Saxenda daily, Disp: 100 each, Rfl: 3   ketoconazole (NIZORAL) 2 % shampoo, Apply 1 application topically as needed for irritation., Disp: , Rfl:    metoprolol succinate (TOPROL-XL) 25 MG 24 hr tablet, Take 1 tablet (25 mg total) by mouth daily. (Patient taking differently: Take 12.5 mg by mouth daily.), Disp: 90 tablet, Rfl: 3   naproxen sodium (ALEVE) 220 MG tablet, Take 220 mg by mouth  daily as needed (Back pain)., Disp: , Rfl:    ondansetron (ZOFRAN-ODT) 4 MG disintegrating tablet, Take 1 tablet (4 mg total) by mouth every 8 (eight) hours as needed for nausea or vomiting., Disp: 20 tablet, Rfl: 0   polyethylene glycol powder (GLYCOLAX/MIRALAX) 17 GM/SCOOP powder, Mix 1 capful in 8 ounces of water and drink up to 2 times daily as needed for constipation., Disp: 255 g, Rfl: 1   tretinoin (RETIN-A) 0.05 % cream, Apply 1 application topically 3 (three) times a week. , Disp: , Rfl:    triamterene-hydrochlorothiazide (MAXZIDE) 75-50 MG tablet, Take 0.5 tablets by mouth daily., Disp: 45 tablet, Rfl: 1 Social History    Socioeconomic History   Marital status: Widowed    Spouse name: Not on file   Number of children: 1   Years of education: Not on file   Highest education level: Not on file  Occupational History   Occupation: Child psychotherapist  Tobacco Use   Smoking status: Never   Smokeless tobacco: Never  Vaping Use   Vaping Use: Never used  Substance and Sexual Activity   Alcohol use: Not Currently    Comment: social-twice a year   Drug use: No   Sexual activity: Not on file  Other Topics Concern   Not on file  Social History Narrative   Son lives nearby and visits daily   Social Determinants of Health   Financial Resource Strain: Low Risk  (04/30/2021)   Overall Financial Resource Strain (CARDIA)    Difficulty of Paying Living Expenses: Not hard at all  Food Insecurity: No Food Insecurity (04/30/2021)   Hunger Vital Sign    Worried About Running Out of Food in the Last Year: Never true    Ran Out of Food in the Last Year: Never true  Transportation Needs: No Transportation Needs (04/30/2021)   PRAPARE - Administrator, Civil Service (Medical): No    Lack of Transportation (Non-Medical): No  Physical Activity: Not on file  Stress: Not on file  Social Connections: Moderately Integrated (04/30/2021)   Social Connection and Isolation Panel [NHANES]    Frequency of Communication with Friends and Family: More than three times a week    Frequency of Social Gatherings with Friends and Family: More than three times a week    Attends Religious Services: More than 4 times per year    Active Member of [employer] or Organizations: Yes    Attends Banker Meetings: More than 4 times per year    Marital Status: Widowed  Intimate Partner Violence: Not At Risk (04/30/2021)   Humiliation, Afraid, Rape, and Kick questionnaire    Fear of Current or Ex-Partner: No    Emotionally Abused: No    Physically Abused: No    Sexually Abused: No   Family History  Adopted: Yes  Problem  Relation Age of Onset   Breast cancer Mother        and 2 other types but don't know what kind   Skin cancer Mother     Objective: Office vital signs reviewed. BP 128/89   Pulse 71   Temp 98.6 F (37 C)   Ht 5\' 9"  (1.753 m)   Wt 239 lb (108.4 kg)   LMP 06/22/2016   SpO2 98%   BMI 35.29 kg/m   Physical Examination:  General: Awake, alert, well nourished, No acute distress HEENT: sclera white, MMM Cardio: regular rate and rhythm, S1S2 heard, no murmurs appreciated Pulm: clear to auscultation  bilaterally, no wheezes, rhonchi or rales; normal work of breathing on room air Neuro: see DM foot  Diabetic Foot Exam - Simple   Simple Foot Form Diabetic Foot exam was performed with the following findings: Yes 09/08/2022  5:06 PM  Visual Inspection See comments: Yes Sensation Testing Intact to touch and monofilament testing bilaterally: Yes Pulse Check Posterior Tibialis and Dorsalis pulse intact bilaterally: Yes Comments She has some irregularity to the great toenail on the right.  This is status post a biopsy.  Otherwise no concerning features.      Assessment/ Plan: 52 y.o. female   New onset type 2 diabetes mellitus - Plan: Bayer DCA Hb A1c Waived, Microalbumin / creatinine urine ratio, Basic Metabolic Panel, OZEMPIC, 0.25 OR 0.5 MG/DOSE, 2 MG/3ML SOPN  Hypertension associated with diabetes  Morbid obesity  Sugar under excellent control with A1c down to 6.0.  She has lost about 15 pounds since her last visit.  She is meeting clinical goals with Ozempic and not having any red flag signs or symptoms.  She will continue current regimen.  This has been renewed and she may follow-up in 4 months, sooner if concerns arise.  Needs diabetic eye exam but diabetic foot exam was performed and urine microalbumin collected as well  Blood pressure well-controlled.  No changes for now  Orders Placed This Encounter  Procedures   Bayer DCA Hb A1c Waived   No orders of the defined types  were placed in this encounter.    Lisa IpAshly M Sachi Boulay, DO [facility] [phone]

## 2022-09-08 NOTE — Patient Instructions (Addendum)
Schedule diabetic eye exam and have result faxed to our office  Diabetes Mellitus and Standards of Medical Care Living with and managing diabetes (diabetes mellitus) can be complicated. Your diabetes treatment may be managed by a team of health care providers, including: A physician who specializes in diabetes (endocrinologist). You might also have visits with a nurse practitioner or physician assistant. Nurses. A registered dietitian. A certified diabetes care and education specialist. An exercise specialist. A pharmacist. An eye doctor. A foot specialist (podiatrist). A dental care provider. A primary care provider. A mental health care provider. How to manage your diabetes You can do many things to successfully manage your diabetes. Your health care providers will follow guidelines to help you get the best quality of care. Here are general guidelines for your diabetes management plan. Your health care providers may give you more specific instructions. Physical exams When you are diagnosed with diabetes, and each year after that, your health care provider will ask about your medical and family history. You will have a physical exam, which may include: Measuring your height, weight, and body mass index (BMI). Checking your blood pressure. This will be done at every routine medical visit. Your target blood pressure may vary depending on your medical conditions, your age, and other factors. A thyroid exam. A skin exam. Screening for nerve damage (peripheral neuropathy). This may include checking the pulse in your legs and feet and the level of sensation in your hands and feet. A foot exam to inspect the structure and skin of your feet, including checking for cuts, bruises, redness, blisters, sores, or other problems. Screening for blood vessel (vascular) problems. This may include checking the pulse in your legs and feet and checking your temperature. Blood tests Depending on your treatment  plan and your personal needs, you may have the following tests: Hemoglobin A1C (HbA1C). This test provides information about blood sugar (glucose) control over the previous 2-3 months. It is used to adjust your treatment plan, if needed. This test will be done: At least 2 times a year, if you are meeting your treatment goals. 4 times a year, if you are not meeting your treatment goals or if your goals have changed. Lipid testing, including total cholesterol, LDL and HDL cholesterol, and triglyceride levels. The goal for LDL is less than 100 mg/dL (5.5 mmol/L). If you are at high risk for complications, the goal is less than 70 mg/dL (3.9 mmol/L). The goal for HDL is 40 mg/dL (2.2 mmol/L) or higher for men, and 50 mg/dL (2.8 mmol/L) or higher for women. An HDL cholesterol of 60 mg/dL (3.3 mmol/L) or higher gives some protection against heart disease. The goal for triglycerides is less than 150 mg/dL (8.3 mmol/L). Liver function tests. Kidney function tests. Thyroid function tests.  Dental and eye exams  Visit your dentist two times a year. If you have type 1 diabetes, your health care provider may recommend an eye exam within 5 years after you are diagnosed, and then once a year after your first exam. For children with type 1 diabetes, the health care provider may recommend an eye exam when your child is age 42 or older and has had diabetes for 3-5 years. After the first exam, your child should get an eye exam once a year. If you have type 2 diabetes, your health care provider may recommend an eye exam as soon as you are diagnosed, and then every 1-2 years after your first exam. Immunizations A yearly flu (influenza) vaccine  is recommended annually for everyone 6 months or older. This is especially important if you have diabetes. The pneumonia (pneumococcal) vaccine is recommended for everyone 2 years or older who has diabetes. If you are age 56 or older, you may get the pneumonia vaccine as a  series of two separate shots. The hepatitis B vaccine is recommended for adults shortly after being diagnosed with diabetes. Adults and children with diabetes should receive all other vaccines according to age-specific recommendations from the Centers for Disease Control and Prevention (CDC). Mental and emotional health Screening for symptoms of eating disorders, anxiety, and depression is recommended at the time of diagnosis and after as needed. If your screening shows that you have symptoms, you may need more evaluation. You may work with a mental health care provider. Follow these instructions at home: Treatment plan You will monitor your blood glucose levels and may give yourself insulin. Your treatment plan will be reviewed at every medical visit. You and your health care provider will discuss: How you are taking your medicines, including insulin. Any side effects you have. Your blood glucose level target goals. How often you monitor your blood glucose level. Lifestyle habits, such as activity level and tobacco, alcohol, and substance use. Education Your health care provider will assess how well you are monitoring your blood glucose levels and whether you are taking your insulin and medicines correctly. He or she may refer you to: A certified diabetes care and education specialist to manage your diabetes throughout your life, starting at diagnosis. A registered dietitian who can create and review your personal nutrition plan. An exercise specialist who can discuss your activity level and exercise plan. General instructions Take over-the-counter and prescription medicines only as told by your health care provider. Keep all follow-up visits. This is important. Where to find support There are many diabetes support networks, including: American Diabetes Association (ADA): diabetes.org Defeat Diabetes Foundation: defeatdiabetes.org Where to find more information American Diabetes  Association (ADA): www.diabetes.org Association of Diabetes Care & Education Specialists (ADCES): diabeteseducator.org International Diabetes Federation (IDF): http://hill.biz/ Summary Managing diabetes (diabetes mellitus) can be complicated. Your diabetes treatment may be managed by a team of health care providers. Your health care providers follow guidelines to help you get the best quality care. You should have physical exams, blood tests, blood pressure monitoring, immunizations, and screening tests regularly. Stay updated on how to manage your diabetes. Your health care providers may also give you more specific instructions based on your individual health. This information is not intended to replace advice given to you by your health care provider. Make sure you discuss any questions you have with your health care provider. Document Revised: 11/24/2019 Document Reviewed: 11/24/2019 Elsevier Patient Education  2023 [employer].   Our records indicate that you are due for your annual mammogram/breast imaging. While there is no way to prevent breast cancer, early detection provides the best opportunity for curing it. For women over the age of 75, the American Cancer Society recommends a yearly clinical breast exam and a yearly mammogram. These practices have saved thousands of lives. We need your help to ensure that you are receiving optimal medical care. Please call the imaging location that has done you previous mammograms. Please remember to list [place] as your primary care. This helps make sure we receive a report and can update your chart.  Below is the contact information for several local breast imaging centers. You may call the location that works best for you, and they will  be happy to assistance in making you an appointment. You do not need an order for a regular screening mammogram. However, if you are having any problems or concerns with you breast area, please let your primary care provider know,  and appropriate orders will be placed. Please let our office know if you have any questions or concerns. Or if you need information for another imaging center not on this list or outside of the area. We are commented to working with you on your health care journey.   The mobile unit/bus ([facility]) - they come twice a month to our location.  These appointments can be made through our office or by call The Breast Center  [facility]  [address] [place], [place] [address] Phone [phone]  [facility] Radiology Department  [address]  [place], [place] [address] [phone]  [facility])  [phone] [address] [place], [place] [address] [phone]  [facility]  [address] [place] [address] [phone]  [facility]  [address] [place] [address] [phone]  [facility] [place]  [address] [place], [place] [address] [phone]  [facility] [address] [place], [place] [address] [phone]  [facility] [address] [place], [place] [address] [phone]  [facility] [address] [place], [place] [address] [phone]

## 2022-09-09 LAB — BASIC METABOLIC PANEL
BUN/Creatinine Ratio: 10 (ref 9–23)
BUN: 8 mg/dL (ref 6–24)
CO2: 26 mmol/L (ref 20–29)
Calcium: 9.8 mg/dL (ref 8.7–10.2)
Chloride: 102 mmol/L (ref 96–106)
Creatinine, Ser: 0.78 mg/dL (ref 0.57–1.00)
Glucose: 98 mg/dL (ref 70–99)
Potassium: 4.2 mmol/L (ref 3.5–5.2)
Sodium: 143 mmol/L (ref 134–144)
eGFR: 91 mL/min/{1.73_m2} (ref >59)

## 2022-09-09 LAB — MICROALBUMIN / CREATININE URINE RATIO
Creatinine, Urine: 54.7 mg/dL
Microalb/Creat Ratio: <5 mg/g creat (ref 0–29)
Microalbumin, Urine: <3 ug/mL

## 2022-09-19 ENCOUNTER — Ambulatory Visit (INDEPENDENT_AMBULATORY_CARE_PROVIDER_SITE_OTHER): Payer: Self-pay

## 2022-09-19 ENCOUNTER — Other Ambulatory Visit: Payer: No Typology Code available for payment source

## 2022-09-19 DIAGNOSIS — B351 Tinea unguium: Secondary | ICD-10-CM

## 2022-09-19 NOTE — Progress Notes (Signed)
Patient presents today for the 3rd laser treatment. Diagnosed with mycotic nail infection by Dr. Ardelle Anton.   Toenail most affected 1st and 5th toenails.  All other systems are negative.  Nails were filed thin. Laser therapy was administered to 1st and 5th toenails bilateral and patient tolerated the treatment well. All safety precautions were in place.    Follow up in 6 weeks for laser # 4.

## 2022-09-26 ENCOUNTER — Telehealth: Payer: Self-pay | Admitting: Podiatry

## 2022-09-26 NOTE — Telephone Encounter (Signed)
Called pt and left her a voicemail that I received an email from K. Orvan Falconer with [employer] letting us know that the email sent to them by [employer] is not going to pay for her laser treatment. Read her from the email that "after review, the information received from Francena Hanly was in fact correct and that the laser treatment is not covered under this authorizations SEOC that is determined by [employer]. Unfortunately, [facility] cannot supersede [employer]'s decision to cover these services. Told pt to call me with any questions and/or concerns.

## 2022-10-06 ENCOUNTER — Ambulatory Visit: Payer: No Typology Code available for payment source | Admitting: Podiatry

## 2022-10-08 ENCOUNTER — Encounter: Payer: No Typology Code available for payment source | Admitting: Family Medicine

## 2022-11-14 ENCOUNTER — Other Ambulatory Visit: Payer: No Typology Code available for payment source

## 2023-01-05 ENCOUNTER — Encounter (HOSPITAL_COMMUNITY): Payer: Self-pay

## 2023-01-05 ENCOUNTER — Emergency Department (HOSPITAL_COMMUNITY)
Admission: EM | Admit: 2023-01-05 | Discharge: 2023-01-05 | Disposition: A | Discharge status: Home / Self Care | Payer: No Typology Code available for payment source | Attending: Emergency Medicine | Admitting: Emergency Medicine

## 2023-01-05 ENCOUNTER — Other Ambulatory Visit: Payer: Self-pay

## 2023-01-05 ENCOUNTER — Telehealth: Payer: Self-pay | Admitting: Family Medicine

## 2023-01-05 DIAGNOSIS — I10 Essential (primary) hypertension: Secondary | ICD-10-CM | POA: Diagnosis present

## 2023-01-05 DIAGNOSIS — R42 Dizziness and giddiness: Secondary | ICD-10-CM | POA: Diagnosis not present

## 2023-01-05 DIAGNOSIS — E119 Type 2 diabetes mellitus without complications: Secondary | ICD-10-CM | POA: Insufficient documentation

## 2023-01-05 DIAGNOSIS — Z91040 Latex allergy status: Secondary | ICD-10-CM | POA: Insufficient documentation

## 2023-01-05 DIAGNOSIS — Z79899 Other long term (current) drug therapy: Secondary | ICD-10-CM | POA: Diagnosis not present

## 2023-01-05 NOTE — Telephone Encounter (Signed)
Scheduled AWVS for patient and she explained she had just been discharged from [facility] for high Blood Pressure     She asked that someone contact her about her blood pressure  Thank you,  Judeth Cornfield,  AMB Clinical Support [facility] AWV Program Direct Dial ??[phone]

## 2023-01-05 NOTE — Telephone Encounter (Signed)
Scheduled ER follow up

## 2023-01-05 NOTE — Discharge Instructions (Signed)
It was a pleasure caring for you today!  Please take your home blood pressure medications with food today and follow up with your doctor in a week.

## 2023-01-05 NOTE — ED Provider Notes (Signed)
[place] EMERGENCY DEPARTMENT AT [facility] Provider Note   CSN: [id number] Arrival date & time: 01/05/23  0813     History  Chief Complaint  Patient presents with   Hypertension    Angle Doto is a 52 y.o. female.  HPI     52 year old female veteran with a history of hypertension, thyroid disease, type 2 diabetes, PTSD, insomnia who presents with concern for hypertension from her dentist's office.  Went to dentist appointment and blood pressure today elevated Just going in for a cleaning.  Has insomnia, didn't sleep much, went to dentist appointment and was found to be high, higher than usual, tried to relax and then check it again, 174/102 when she was leaving.  150s/104 when checked in, then was 170s.    Feels sort of dizzy when doesn't get enough sleep, just a little off, not super dizzy, feels that way typically if doesn't get enough sleep and that is how she is feeing today. Denies numbness, weakness, difficulty talking or walking, visual changes or facial droop.  No headache.  Does not notice the slight dizzy feeling while laying back, feels like more need rest, didn't sleep well last night but wanted to go to her 7AM dentist appointment  No chest pain or dyspnea, no n/v/fever/cough  Taking triamterene-hydrochlorothiazide 75mg  ,  .5 tablets  Metoprolol taking 12.5 daily (half tab)  Took her medication yesterday but not today, didn't take it Friday Needs to take it with food and did not take it this morning.    [facility] in [place]    Past Medical History:  Diagnosis Date   Allergy    Anemia    Anxiety    Back pain 04/1992   chronic back pain-diagnose from [place]   Depression    Headache    frequent headache   Hypertension    PTSD (post-traumatic stress disorder)    Thyroid disease    Uterine fibroid      Home Medications Prior to Admission medications   Medication Sig Start Date End Date Taking? Authorizing Provider   Biotin w/ Vitamins C & E (HAIR/SKIN/NAILS PO) Take by mouth.    [provider]  cetirizine (ZYRTEC) 10 MG tablet Take 10 mg by mouth at bedtime.    [provider]  Cholecalciferol (VITAMIN D) 2000 UNITS tablet Take 4,000 Units by mouth at bedtime.     [provider]  ciclopirox (PENLAC) 8 % solution Apply topically at bedtime. Apply over nail and surrounding skin. Apply daily over previous coat. After seven (7) days, may remove with alcohol and continue cycle. 03/17/22   Vivi Barrack, DPM  clotrimazole (LOTRIMIN) 1 % cream Apply 1 Application topically 2 (two) times daily. 08/06/22   Vivi Barrack, DPM  Efinaconazole 10 % SOLN Apply 1 drop topically daily. 07/08/22   Vivi Barrack, DPM  FLUoxetine (PROZAC) 20 MG capsule Take 1 capsule (20 mg total) by mouth daily. (NEEDS TO BE SEEN BEFORE NEXT REFILL) 03/29/21   Delynn Flavin M, DO  Insulin Pen Needle (NOVOFINE PLUS PEN NEEDLE) 32G X 4 MM MISC UAD to inject Saxenda daily 11/04/21   Delynn Flavin M, DO  ketoconazole (NIZORAL) 2 % shampoo Apply 1 application topically as needed for irritation.    [provider]  metoprolol succinate (TOPROL-XL) 25 MG 24 hr tablet Take 1 tablet (25 mg total) by mouth daily. Patient taking differently: Take 12.5 mg by mouth daily. 03/29/18   Alysia Penna  Lum, NP  naproxen sodium (ALEVE) 220 MG tablet Take 220 mg by mouth daily as needed (Back pain).    [provider]  ondansetron (ZOFRAN-ODT) 4 MG disintegrating tablet Take 1 tablet (4 mg total) by mouth every 8 (eight) hours as needed for nausea or vomiting. 06/17/22   Gottschalk, Kathie Rhodes M, DO  OZEMPIC, 0.25 OR 0.5 MG/DOSE, 2 MG/3ML SOPN Inject 0.5 mg into the skin every 7 (seven) days. 09/08/22   [name], DO  polyethylene glycol powder (GLYCOLAX/MIRALAX) 17 GM/SCOOP powder Mix 1 capful in 8 ounces of water and drink up to 2 times daily as needed for constipation. 06/17/22   [name], DO  tretinoin (RETIN-A) 0.05 % cream Apply 1 application topically 3 (three) times a week.     [provider]  triamterene-hydrochlorothiazide (MAXZIDE) 75-50 MG tablet Take 0.5 tablets by mouth daily. 03/29/18   Nche, Bonna Gains, NP      Allergies    Latex    Review of Systems   Review of Systems  Physical Exam Updated Vital Signs BP (!) 169/106   Pulse 63   Temp 98 F (36.7 C) (Oral)   Resp 16   Ht 5\' 9"  (1.753 m)   Wt 106.6 kg   LMP 06/22/2016   SpO2 100%   BMI 34.70 kg/m  Physical Exam Vitals and nursing note reviewed.  Constitutional:      General: She is not in acute distress.    Appearance: Normal appearance. She is well-developed. She is not ill-appearing or diaphoretic.  HENT:     Head: Normocephalic and atraumatic.  Eyes:     General: No visual field deficit.    Extraocular Movements: Extraocular movements intact.     Conjunctiva/sclera: Conjunctivae normal.     Pupils: Pupils are equal, round, and reactive to light.  Cardiovascular:     Rate and Rhythm: Normal rate and regular rhythm.     Pulses: Normal pulses.     Heart sounds: Normal heart sounds. No murmur heard.    No friction rub. No gallop.  Pulmonary:     Effort: Pulmonary effort is normal. No respiratory distress.     Breath sounds: Normal breath sounds. No wheezing or rales.  Abdominal:     General: There is no distension.     Palpations: Abdomen is soft.     Tenderness: There is no abdominal tenderness. There is no guarding.  Musculoskeletal:        General: No swelling or tenderness.     Cervical back: Normal range of motion.  Skin:    General: Skin is warm and dry.     Findings: No erythema or rash.  Neurological:     General: No focal deficit present.     Mental Status: She is alert and oriented to person, place, and time.     GCS: GCS eye subscore is 4. GCS verbal subscore is 5. GCS motor subscore is 6.     Cranial Nerves: No cranial nerve deficit, dysarthria or facial  asymmetry.     Sensory: No sensory deficit.     Motor: No weakness or tremor.     Coordination: Coordination normal. Finger-Nose-Finger Test normal.     Gait: Gait normal.     ED Results / Procedures / Treatments   Labs (all labs ordered are listed, but only abnormal results are displayed) Labs Reviewed - No data to display  EKG EKG Interpretation Date/Time:  Monday January 05 2023 08:32:27 EDT  Ventricular Rate:  61 PR Interval:  176 QRS Duration:  108 QT Interval:  426 QTC Calculation: 430 R Axis:   18  Text Interpretation: Sinus rhythm No significant change since last tracing Confirmed by Alvira Monday ([id number]) on 01/05/2023 9:18:51 AM  Radiology No results found.  Procedures Procedures    Medications Ordered in ED Medications - No data to display  ED Course/ Medical Decision Making/ A&P                                  52 year old female veteran with a history of hypertension, thyroid disease, type 2 diabetes, PTSD, insomnia who presents with concern for hypertension from her dentist's office.  She is without headache, no neurologic symptoms, no chest pain, no shortness of breath and have low suspicion for hypertensive emergencies including low suspicion for [facility], hypertensive encephalopathy, stroke, MI, aortic dissection, pulmonary edema.  Does describe a slight sensation of dizziness that similar to when she has not had enough sleep in the past, has a normal neurologic exam including normal gait, negative Romberg, normal finger-to-nose, no headache or other neurologic symptoms, and description of symptoms are not consistent with CVA.    Had lab work done in April which showed normal renal function, has a primary care doctor for continued follow-up.    Her blood pressures here in the emergency department are 169/106 in the setting of her not taking her blood pressure medication today, as well as being under the stress of a dentist appointment and emergency department  visit today.  At this time, since she has not taken her medications yet today, do not feel that medication adjustment is indicated.  Recommend she take her regularly scheduled antihypertensives, and follow-up with her primary care doctor.  Discussed reasons to return to the emergency department in detail. Patient discharged in stable condition with understanding of reasons to return.          Final Clinical Impression(s) / ED Diagnoses Final diagnoses:  Hypertension, unspecified type    Rx / DC Orders ED Discharge Orders     None         Alvira Monday, MD 01/05/23 [phone]

## 2023-01-05 NOTE — ED Triage Notes (Signed)
Pt BIBPOV for high blood pressure. While at the dentist this morning for a cleaning her blood pressure was checked multiple times and did not decrease at all, 174/102.  Pt has a hx of high blood pressure and is medication complaint. Pt did not take her blood pressure medicine today. Pt reports a some lightheadedness starting this AM.

## 2023-01-06 ENCOUNTER — Ambulatory Visit (INDEPENDENT_AMBULATORY_CARE_PROVIDER_SITE_OTHER): Payer: Medicare HMO

## 2023-01-06 ENCOUNTER — Ambulatory Visit (INDEPENDENT_AMBULATORY_CARE_PROVIDER_SITE_OTHER): Payer: Medicare HMO | Admitting: Family

## 2023-01-06 ENCOUNTER — Encounter: Payer: Self-pay | Admitting: Family

## 2023-01-06 VITALS — Ht 69.0 in | Wt 233.0 lb

## 2023-01-06 VITALS — BP 153/89 | HR 70 | Temp 97.4°F | Ht 69.0 in | Wt 233.6 lb

## 2023-01-06 DIAGNOSIS — I152 Hypertension secondary to endocrine disorders: Secondary | ICD-10-CM | POA: Diagnosis not present

## 2023-01-06 DIAGNOSIS — F419 Anxiety disorder, unspecified: Secondary | ICD-10-CM

## 2023-01-06 DIAGNOSIS — Z0000 Encounter for general adult medical examination without abnormal findings: Secondary | ICD-10-CM

## 2023-01-06 DIAGNOSIS — E1159 Type 2 diabetes mellitus with other circulatory complications: Secondary | ICD-10-CM | POA: Diagnosis not present

## 2023-01-06 DIAGNOSIS — Z09 Encounter for follow-up examination after completed treatment for conditions other than malignant neoplasm: Secondary | ICD-10-CM | POA: Diagnosis not present

## 2023-01-06 MED ORDER — AMLODIPINE BESYLATE 5 MG PO TABS: 5.0000 mg | ORAL_TABLET | Freq: Every day | ORAL | 1 refills | Status: DC

## 2023-01-06 NOTE — Patient Instructions (Signed)
Lisa Boyer , Thank you for taking time to come for your Medicare Wellness Visit. I appreciate your ongoing commitment to your health goals. Please review the following plan we discussed and let me know if I can assist you in the future.   Referrals/Orders/Follow-Ups/Clinician Recommendations: Aim for 30 minutes of exercise or brisk walking, 6-8 glasses of water, and 5 servings of fruits and vegetables each day.   This is a list of the screening recommended for you and due dates:  Health Maintenance  Topic Date Due   Eye exam for diabetics  Never done   HIV Screening  Never done   Hepatitis C Screening  Never done   DTaP/Tdap/Td vaccine (1 - Tdap) Never done   Pap Smear  Never done   Colon Cancer Screening  Never done   COVID-19 Vaccine (3 - Pfizer risk series) 09/17/2019   Mammogram  05/15/2021   Flu Shot  01/01/2023   Hemoglobin A1C  03/10/2023   Yearly kidney function blood test for diabetes  09/08/2023   Yearly kidney health urinalysis for diabetes  09/08/2023   Complete foot exam   09/08/2023   Medicare Annual Wellness Visit  01/06/2024   Zoster (Shingles) Vaccine  Completed   HPV Vaccine  Aged Out    Advanced directives: (ACP Link)Information on Advanced Care Planning can be found at [facility] Advance Health Care Directives Advance Health Care Directives ([email or web address] Information on Advanced Care Planning can be found at [facility] Advance Health Care Directives Advance Health Care Directives ([email or web address]    Next Medicare Annual Wellness Visit scheduled for next year: Yes  Preventive Care 40-64 Years, Female Preventive care refers to lifestyle choices and visits with your health care provider that can promote health and wellness. What does preventive care include? A yearly physical exam. This is also called an annual well check. Dental exams once or twice a year. Routine eye exams. Ask your health care provider how often you should  have your eyes checked. Personal lifestyle choices, including: Daily care of your teeth and gums. Regular physical activity. Eating a healthy diet. Avoiding tobacco and drug use. Limiting alcohol use. Practicing safe sex. Taking low-dose aspirin daily starting at age 47. Taking vitamin and mineral supplements as recommended by your health care provider. What happens during an annual well check? The services and screenings done by your health care provider during your annual well check will depend on your age, overall health, lifestyle risk factors, and family history of disease. Counseling  Your health care provider may ask you questions about your: Alcohol use. Tobacco use. Drug use. Emotional well-being. Home and relationship well-being. Sexual activity. Eating habits. Work and work Astron[employer]. Method of birth control. Menstrual cycle. Pregnancy history. Screening  You may have the following tests or measurements: Height, weight, and BMI. Blood pressure. Lipid and cholesterol levels. These may be checked every 5 years, or more frequently if you are over 32 years old. Skin check. Lung cancer screening. You may have this screening every year starting at age 56 if you have a 30-pack-year history of smoking and currently smoke or have quit within the past 15 years. Fecal occult blood test (FOBT) of the stool. You may have this test every year starting at age 72. Flexible sigmoidoscopy or colonoscopy. You may have a sigmoidoscopy every 5 years or a colonoscopy every 10 years starting at age 60. Hepatitis C blood test. Hepatitis B blood test. Sexually transmitted disease (STD) testing.  Diabetes screening. This is done by checking your blood sugar (glucose) after you have not eaten for a while (fasting). You may have this done every 1-3 years. Mammogram. This may be done every 1-2 years. Talk to your health care provider about when you should start having regular mammograms. This  may depend on whether you have a family history of breast cancer. BRCA-related cancer screening. This may be done if you have a family history of breast, ovarian, tubal, or peritoneal cancers. Pelvic exam and Pap test. This may be done every 3 years starting at age 80. Starting at age 52, this may be done every 5 years if you have a Pap test in combination with an HPV test. Bone density scan. This is done to screen for osteoporosis. You may have this scan if you are at high risk for osteoporosis. Discuss your test results, treatment options, and if necessary, the need for more tests with your health care provider. Vaccines  Your health care provider may recommend certain vaccines, such as: Influenza vaccine. This is recommended every year. Tetanus, diphtheria, and acellular pertussis (Tdap, Td) vaccine. You may need a Td booster every 10 years. Zoster vaccine. You may need this after age 15. Pneumococcal 13-valent conjugate (PCV13) vaccine. You may need this if you have certain conditions and were not previously vaccinated. Pneumococcal polysaccharide (PPSV23) vaccine. You may need one or two doses if you smoke cigarettes or if you have certain conditions. Talk to your health care provider about which screenings and vaccines you need and how often you need them. This information is not intended to replace advice given to you by your health care provider. Make sure you discuss any questions you have with your health care provider. Document Released: 06/15/2015 Document Revised: 02/06/2016 Document Reviewed: 03/20/2015 Elsevier Interactive Patient Education  2017 [employer].    Fall Prevention in the Home Falls can cause injuries. They can happen to people of all ages. There are many things you can do to make your home safe and to help prevent falls. What can I do on the outside of my home? Regularly fix the edges of walkways and driveways and fix any cracks. Remove anything that might make  you trip as you walk through a door, such as a raised step or threshold. Trim any bushes or trees on the path to your home. Use bright outdoor lighting. Clear any walking paths of anything that might make someone trip, such as rocks or tools. Regularly check to see if handrails are loose or broken. Make sure that both sides of any steps have handrails. Any raised decks and porches should have guardrails on the edges. Have any leaves, snow, or ice cleared regularly. Use sand or salt on walking paths during winter. Clean up any spills in your garage right away. This includes oil or grease spills. What can I do in the bathroom? Use night lights. Install grab bars by the toilet and in the tub and shower. Do not use towel bars as grab bars. Use non-skid mats or decals in the tub or shower. If you need to sit down in the shower, use a plastic, non-slip stool. Keep the floor dry. Clean up any water that spills on the floor as soon as it happens. Remove soap buildup in the tub or shower regularly. Attach bath mats securely with double-sided non-slip rug tape. Do not have throw rugs and other things on the floor that can make you trip. What can I do in  the bedroom? Use night lights. Make sure that you have a light by your bed that is easy to reach. Do not use any sheets or blankets that are too big for your bed. They should not hang down onto the floor. Have a firm chair that has side arms. You can use this for support while you get dressed. Do not have throw rugs and other things on the floor that can make you trip. What can I do in the kitchen? Clean up any spills right away. Avoid walking on wet floors. Keep items that you use a lot in easy-to-reach places. If you need to reach something above you, use a strong step stool that has a grab bar. Keep electrical cords out of the way. Do not use floor polish or wax that makes floors slippery. If you must use wax, use non-skid floor wax. Do not  have throw rugs and other things on the floor that can make you trip. What can I do with my stairs? Do not leave any items on the stairs. Make sure that there are handrails on both sides of the stairs and use them. Fix handrails that are broken or loose. Make sure that handrails are as long as the stairways. Check any carpeting to make sure that it is firmly attached to the stairs. Fix any carpet that is loose or worn. Avoid having throw rugs at the top or bottom of the stairs. If you do have throw rugs, attach them to the floor with carpet tape. Make sure that you have a light switch at the top of the stairs and the bottom of the stairs. If you do not have them, ask someone to add them for you. What else can I do to help prevent falls? Wear shoes that: Do not have high heels. Have rubber bottoms. Are comfortable and fit you well. Are closed at the toe. Do not wear sandals. If you use a stepladder: Make sure that it is fully opened. Do not climb a closed stepladder. Make sure that both sides of the stepladder are locked into place. Ask someone to hold it for you, if possible. Clearly mark and make sure that you can see: Any grab bars or handrails. First and last steps. Where the edge of each step is. Use tools that help you move around (mobility aids) if they are needed. These include: Canes. Walkers. Scooters. Crutches. Turn on the lights when you go into a dark area. Replace any light bulbs as soon as they burn out. Set up your furniture so you have a clear path. Avoid moving your furniture around. If any of your floors are uneven, fix them. If there are any pets around you, be aware of where they are. Review your medicines with your doctor. Some medicines can make you feel dizzy. This can increase your chance of falling. Ask your doctor what other things that you can do to help prevent falls. This information is not intended to replace advice given to you by your health care  provider. Make sure you discuss any questions you have with your health care provider. Document Released: 03/15/2009 Document Revised: 10/25/2015 Document Reviewed: 06/23/2014 Elsevier Interactive Patient Education  2017 [employer].

## 2023-01-06 NOTE — Patient Instructions (Signed)
Hypertension, Adult High blood pressure (hypertension) is when the force of blood pumping through the arteries is too strong. The arteries are the blood vessels that carry blood from the heart throughout the body. Hypertension forces the heart to work harder to pump blood and may cause arteries to become narrow or stiff. Untreated or uncontrolled hypertension can lead to a heart attack, heart failure, a stroke, kidney disease, and other problems. A blood pressure reading consists of a higher number over a lower number. Ideally, your blood pressure should be below 120/80. The first ("top") number is called the systolic pressure. It is a measure of the pressure in your arteries as your heart beats. The second ("bottom") number is called the diastolic pressure. It is a measure of the pressure in your arteries as the heart relaxes. What are the causes? The exact cause of this condition is not known. There are some conditions that result in high blood pressure. What increases the risk? Certain factors may make you more likely to develop high blood pressure. Some of these risk factors are under your control, including: Smoking. Not getting enough exercise or physical activity. Being overweight. Having too much fat, sugar, calories, or salt (sodium) in your diet. Drinking too much alcohol. Other risk factors include: Having a personal history of heart disease, diabetes, high cholesterol, or kidney disease. Stress. Having a family history of high blood pressure and high cholesterol. Having obstructive sleep apnea. Age. The risk increases with age. What are the signs or symptoms? High blood pressure may not cause symptoms. Very high blood pressure (hypertensive crisis) may cause: Headache. Fast or irregular heartbeats (palpitations). Shortness of breath. Nosebleed. Nausea and vomiting. Vision changes. Severe chest pain, dizziness, and seizures. How is this diagnosed? This condition is diagnosed by  measuring your blood pressure while you are seated, with your arm resting on a flat surface, your legs uncrossed, and your feet flat on the floor. The cuff of the blood pressure monitor will be placed directly against the skin of your upper arm at the level of your heart. Blood pressure should be measured at least twice using the same arm. Certain conditions can cause a difference in blood pressure between your right and left arms. If you have a high blood pressure reading during one visit or you have normal blood pressure with other risk factors, you may be asked to: Return on a different day to have your blood pressure checked again. Monitor your blood pressure at home for 1 week or longer. If you are diagnosed with hypertension, you may have other blood or imaging tests to help your health care provider understand your overall risk for other conditions. How is this treated? This condition is treated by making healthy lifestyle changes, such as eating healthy foods, exercising more, and reducing your alcohol intake. You may be referred for counseling on a healthy diet and physical activity. Your health care provider may prescribe medicine if lifestyle changes are not enough to get your blood pressure under control and if: Your systolic blood pressure is above 130. Your diastolic blood pressure is above 80. Your personal target blood pressure may vary depending on your medical conditions, your age, and other factors. Follow these instructions at home: Eating and drinking  Eat a diet that is high in fiber and potassium, and low in sodium, added sugar, and fat. An example of this eating plan is called the DASH diet. DASH stands for Dietary Approaches to Stop Hypertension. To eat this way: Eat   plenty of fresh fruits and vegetables. Try to fill one half of your plate at each meal with fruits and vegetables. Eat whole grains, such as whole-wheat pasta, brown rice, or whole-grain bread. Fill about one  fourth of your plate with whole grains. Eat or drink low-fat dairy products, such as skim milk or low-fat yogurt. Avoid fatty cuts of meat, processed or cured meats, and poultry with skin. Fill about one fourth of your plate with lean proteins, such as fish, chicken without skin, beans, eggs, or tofu. Avoid pre-made and processed foods. These tend to be higher in sodium, added sugar, and fat. Reduce your daily sodium intake. Many people with hypertension should eat less than 1,500 mg of sodium a day. Do not drink alcohol if: Your health care provider tells you not to drink. You are pregnant, may be pregnant, or are planning to become pregnant. If you drink alcohol: Limit how much you have to: 0-1 drink a day for women. 0-2 drinks a day for men. Know how much alcohol is in your drink. In the [place] one drink equals one 12 oz bottle of beer (355 mL), one 5 oz glass of wine (148 mL), or one 1 oz glass of hard liquor (44 mL). Lifestyle  Work with your health care provider to maintain a healthy body weight or to lose weight. Ask what an ideal weight is for you. Get at least 30 minutes of exercise that causes your heart to beat faster (aerobic exercise) most days of the week. Activities may include walking, swimming, or biking. Include exercise to strengthen your muscles (resistance exercise), such as Pilates or lifting weights, as part of your weekly exercise routine. Try to do these types of exercises for 30 minutes at least 3 days a week. Do not use any products that contain nicotine or tobacco. These products include cigarettes, chewing tobacco, and vaping devices, such as e-cigarettes. If you need help quitting, ask your health care provider. Monitor your blood pressure at home as told by your health care provider. Keep all follow-up visits. This is important. Medicines Take over-the-counter and prescription medicines only as told by your health care provider. Follow directions carefully. Blood  pressure medicines must be taken as prescribed. Do not skip doses of blood pressure medicine. Doing this puts you at risk for problems and can make the medicine less effective. Ask your health care provider about side effects or reactions to medicines that you should watch for. Contact a health care provider if you: Think you are having a reaction to a medicine you are taking. Have headaches that keep coming back (recurring). Feel dizzy. Have swelling in your ankles. Have trouble with your vision. Get help right away if you: Develop a severe headache or confusion. Have unusual weakness or numbness. Feel faint. Have severe pain in your chest or abdomen. Vomit repeatedly. Have trouble breathing. These symptoms may be an emergency. Get help right away. Call 911. Do not wait to see if the symptoms will go away. Do not drive yourself to the hospital. Summary Hypertension is when the force of blood pumping through your arteries is too strong. If this condition is not controlled, it may put you at risk for serious complications. Your personal target blood pressure may vary depending on your medical conditions, your age, and other factors. For most people, a normal blood pressure is less than 120/80. Hypertension is treated with lifestyle changes, medicines, or a combination of both. Lifestyle changes include losing weight, eating a healthy,   low-sodium diet, exercising more, and limiting alcohol. This information is not intended to replace advice given to you by your health care provider. Make sure you discuss any questions you have with your health care provider. Document Revised: 03/26/2021 Document Reviewed: 03/26/2021 Elsevier Patient Education  2024 [employer]  

## 2023-01-06 NOTE — Progress Notes (Signed)
Subjective:   Lisa Boyer is a 52 y.o. female who presents for Medicare Annual (Subsequent) preventive examination.  Visit Complete: Virtual  I connected with  [name] on 01/06/23 by a audio enabled telemedicine application and verified that I am speaking with the correct person using two identifiers.  Patient Location: Home  Provider Location: Home Office  I discussed the limitations of evaluation and management by telemedicine. The patient expressed understanding and agreed to proceed.  Patient Medicare AWV questionnaire was completed by the patient on 01/06/2023; I have confirmed that all information answered by patient is correct and no changes since this date.  Review of Systems    Vital Signs: Unable to obtain new vitals due to this being a telehealth visit.  Cardiac Risk Factors include: advanced age (>51men, >13 women);hypertension;diabetes mellitus;dyslipidemia Nutrition Risk Assessment:  Has the patient had any N/V/D within the last 2 months?  No  Does the patient have any non-healing wounds?  No  Has the patient had any unintentional weight loss or weight gain?  No   Diabetes:  Is the patient diabetic?  Yes  If diabetic, was a CBG obtained today?  No  Did the patient bring in their glucometer from home?  No  How often do you monitor your CBG's? Never .   Financial Strains and Diabetes Management:  Are you having any financial strains with the device, your supplies or your medication? No .  Does the patient want to be seen by Chronic Care Management for management of their diabetes?  No  Would the patient like to be referred to a Nutritionist or for Diabetic Management?  No   Diabetic Exams:  Diabetic Eye Exam: Overdue for diabetic eye exam. Pt has been advised about the importance in completing this exam. Patient advised to call and schedule an eye exam. Diabetic Foot Exam: Overdue, Pt has been advised about the importance in completing this exam. Pt is  scheduled for diabetic foot exam on next office visit .     Objective:    Today's Vitals   01/06/23 1300  Weight: 233 lb (105.7 kg)  Height: 5\' 9"  (1.753 m)   Body mass index is 34.41 kg/m.     01/06/2023    1:04 PM 01/05/2023    8:30 AM 04/30/2021    4:28 PM 11/19/2019    6:06 AM 09/06/2014    6:44 AM  Advanced Directives  Does Patient Have a Medical Advance Directive? No No;Yes No No Yes  Type of Advance Directive     Living will  Does patient want to make changes to medical advance directive?  No - Patient declined   No - Patient declined  Copy of Healthcare Power of Attorney in Chart?     No - copy requested  Would patient like information on creating a medical advance directive? Yes (MAU/Ambulatory/Procedural Areas - Information given)  No - Patient declined No - Patient declined     Current Medications (verified) Outpatient Encounter Medications as of 01/06/2023  Medication Sig   amLODipine (NORVASC) 5 MG tablet Take 1 tablet (5 mg total) by mouth daily.   Biotin w/ Vitamins C & E (HAIR/SKIN/NAILS PO) Take by mouth.   cetirizine (ZYRTEC) 10 MG tablet Take 10 mg by mouth at bedtime.   Cholecalciferol (VITAMIN D) 2000 UNITS tablet Take 4,000 Units by mouth at bedtime.    ciclopirox (PENLAC) 8 % solution Apply topically at bedtime. Apply over nail and surrounding skin. Apply daily over previous  coat. After seven (7) days, may remove with alcohol and continue cycle.   clotrimazole (LOTRIMIN) 1 % cream Apply 1 Application topically 2 (two) times daily.   Efinaconazole 10 % SOLN Apply 1 drop topically daily.   FLUoxetine (PROZAC) 20 MG capsule Take 1 capsule (20 mg total) by mouth daily. (NEEDS TO BE SEEN BEFORE NEXT REFILL)   Insulin Pen Needle (NOVOFINE PLUS PEN NEEDLE) 32G X 4 MM MISC UAD to inject Saxenda daily   ketoconazole (NIZORAL) 2 % shampoo Apply 1 application topically as needed for irritation.   metoprolol succinate (TOPROL-XL) 25 MG 24 hr tablet Take 1 tablet (25 mg  total) by mouth daily. (Patient taking differently: Take 12.5 mg by mouth daily.)   naproxen sodium (ALEVE) 220 MG tablet Take 220 mg by mouth daily as needed (Back pain).   ondansetron (ZOFRAN-ODT) 4 MG disintegrating tablet Take 1 tablet (4 mg total) by mouth every 8 (eight) hours as needed for nausea or vomiting.   OZEMPIC, 0.25 OR 0.5 MG/DOSE, 2 MG/3ML SOPN Inject 0.5 mg into the skin every 7 (seven) days.   polyethylene glycol powder (GLYCOLAX/MIRALAX) 17 GM/SCOOP powder Mix 1 capful in 8 ounces of water and drink up to 2 times daily as needed for constipation.   tretinoin (RETIN-A) 0.05 % cream Apply 1 application topically 3 (three) times a week.    triamterene-hydrochlorothiazide (MAXZIDE) 75-50 MG tablet Take 0.5 tablets by mouth daily.   No facility-administered encounter medications on file as of 01/06/2023.    Allergies (verified) Latex   History: Past Medical History:  Diagnosis Date   Allergy    Anemia    Anxiety    Back pain 04/1992   chronic back pain-diagnose from [place]   Depression    Headache    frequent headache   Hypertension    PTSD (post-traumatic stress disorder)    Thyroid disease    Uterine fibroid    Past Surgical History:  Procedure Laterality Date   ABDOMINAL HYSTERECTOMY  08/13/2017   left ovary removed   BREAST BIOPSY Bilateral 1888/1191/1994/2001   remove growth from both side breast   ENDOMETRIAL ABLATION     THYROIDECTOMY  08/1999   partial   Family History  Adopted: Yes  Problem Relation Age of Onset   Breast cancer Mother        and 2 other types but don't know what kind   Skin cancer Mother    Social History   Socioeconomic History   Marital status: Widowed    Spouse name: Not on file   Number of children: 1   Years of education: Not on file   Highest education level: Not on file  Occupational History   Occupation: Child psychotherapist  Tobacco Use   Smoking status: Never   Smokeless tobacco: Never  Vaping Use   Vaping status:  Never Used  Substance and Sexual Activity   Alcohol use: Not Currently    Comment: social-twice a year   Drug use: No   Sexual activity: Not on file  Other Topics Concern   Not on file  Social History Narrative   Son lives nearby and visits daily   Social Determinants of Health   Financial Resource Strain: Low Risk  (01/06/2023)   Overall Financial Resource Strain (CARDIA)    Difficulty of Paying Living Expenses: Not hard at all  Food Insecurity: No Food Insecurity (01/06/2023)   Hunger Vital Sign    Worried About Running Out of Food in the Last  Year: Never true    Ran Out of Food in the Last Year: Never true  Transportation Needs: No Transportation Needs (01/06/2023)   PRAPARE - Administrator, Civil Service (Medical): No    Lack of Transportation (Non-Medical): No  Physical Activity: Insufficiently Active (01/06/2023)   Exercise Vital Sign    Days of Exercise per Week: 3 days    Minutes of Exercise per Session: 30 min  Stress: No Stress Concern Present (01/06/2023)   [employer] of Occupational Health - Occupational Stress Questionnaire    Feeling of Stress : Not at all  Social Connections: Moderately Integrated (01/06/2023)   Social Connection and Isolation Panel [NHANES]    Frequency of Communication with Friends and Family: More than three times a week    Frequency of Social Gatherings with Friends and Family: More than three times a week    Attends Religious Services: More than 4 times per year    Active Member of [employer] or Organizations: Yes    Attends Banker Meetings: More than 4 times per year    Marital Status: Widowed    Tobacco Counseling Counseling given: Not Answered   Clinical Intake:  Pre-visit preparation completed: Yes  Pain : No/denies pain     Nutritional Risks: None Diabetes: Yes CBG done?: No Did pt. bring in CBG monitor from home?: No  How often do you need to have someone help you when you read instructions,  pamphlets, or other written materials from your doctor or pharmacy?: 1 - Never  Interpreter Needed?: No  Information entered by :: Renie Ora, LPN   Activities of Daily Living    01/06/2023    1:04 PM  In your present state of health, do you have any difficulty performing the following activities:  Hearing? 0  Vision? 0  Difficulty concentrating or making decisions? 0  Walking or climbing stairs? 0  Dressing or bathing? 0  Doing errands, shopping? 0  Preparing Food and eating ? N  Using the Toilet? N  In the past six months, have you accidently leaked urine? N  Do you have problems with loss of bowel control? N  Managing your Medications? N  Managing your Finances? N  Housekeeping or managing your Housekeeping? N    Patient Care Team: [name], DO as PCP - General (Family Medicine)  Indicate any recent Medical Services you may have received from other than [facility] providers in the past year (date may be approximate).     Assessment:   This is a routine wellness examination for Lisa Boyer.  Hearing/Vision screen Vision Screening - Comments:: Wears rx glasses - [facility]  Dietary issues and exercise activities discussed:     Goals Addressed             This Visit's Progress    Exercise 3x per week (30 min per time)         Depression Screen    01/06/2023    1:02 PM 01/06/2023    8:47 AM 09/08/2022    3:43 PM 06/09/2022    2:52 PM 05/30/2022   10:55 AM 02/07/2022   11:28 AM 12/26/2021    8:38 AM  PHQ 2/9 Scores  PHQ - 2 Score 0 1 4 4 2 2 2   PHQ- 9 Score 0 17 14 23 14 17 16     Fall Risk    01/06/2023    1:01 PM 01/06/2023  8:46 AM 06/09/2022    2:52 PM 05/30/2022   10:55 AM 02/07/2022   11:27 AM  Fall Risk   Falls in the past year? 0 1 1 0 0  Number falls in past yr: 0 0 0    Injury with Fall? 0 0 1    Risk for fall due to : No Fall Risks Impaired balance/gait History of fall(s)    Follow up Falls prevention discussed  Falls evaluation completed;Education provided Education provided      MEDICARE RISK AT HOME:  Medicare Risk at Home - 01/06/23 1301     Any stairs in or around the home? Yes    If so, are there any without handrails? No    Home free of loose throw rugs in walkways, pet beds, electrical cords, etc? Yes    Adequate lighting in your home to reduce risk of falls? Yes    Life alert? No    Use of a cane, walker or w/c? No    Grab bars in the bathroom? Yes    Shower chair or bench in shower? Yes    Elevated toilet seat or a handicapped toilet? Yes             TIMED UP AND GO:  Was the test performed?  No    Cognitive Function:        01/06/2023    1:04 PM 04/30/2021    4:18 PM  6CIT Screen  What Year? 0 points 0 points  What month? 0 points 0 points  What time? 0 points 0 points  Count back from 20 0 points 0 points  Months in reverse 0 points 2 points  Repeat phrase 0 points 2 points  Total Score 0 points 4 points    Immunizations Immunization History  Administered Date(s) Administered   Influenza,inj,Quad PF,6+ Mos 04/15/2021   Influenza-Unspecified 06/10/2020   PFIZER(Purple Top)SARS-COV-2 Vaccination 07/30/2019, 08/20/2019   Zoster Recombinant(Shingrix) 04/06/2020, 09/26/2020    TDAP status: Due, Education has been provided regarding the importance of this vaccine. Advised may receive this vaccine at local pharmacy or Health Dept. Aware to provide a copy of the vaccination record if obtained from local pharmacy or Health Dept. Verbalized acceptance and understanding.  Flu Vaccine status: Up to date  Pneumococcal vaccine status: Due, Education has been provided regarding the importance of this vaccine. Advised may receive this vaccine at local pharmacy or Health Dept. Aware to provide a copy of the vaccination record if obtained from local pharmacy or Health Dept. Verbalized acceptance and understanding.  Covid-19 vaccine status: Completed vaccines  Qualifies for  Shingles Vaccine? Yes   Zostavax completed Yes   Shingrix Completed?: Yes  Screening Tests Health Maintenance  Topic Date Due   OPHTHALMOLOGY EXAM  Never done   HIV Screening  Never done   Hepatitis C Screening  Never done   DTaP/Tdap/Td (1 - Tdap) Never done   PAP SMEAR-Modifier  Never done   Colonoscopy  Never done   COVID-19 Vaccine (3 - Pfizer risk series) 09/17/2019   MAMMOGRAM  05/15/2021   INFLUENZA VACCINE  01/01/2023   HEMOGLOBIN A1C  03/10/2023   Diabetic kidney evaluation - eGFR measurement  09/08/2023   Diabetic kidney evaluation - Urine ACR  09/08/2023   FOOT EXAM  09/08/2023   Medicare Annual Wellness (AWV)  01/06/2024   Zoster Vaccines- Shingrix  Completed   HPV VACCINES  Aged Out    Health Maintenance  Health Maintenance Due  Topic Date  Due   OPHTHALMOLOGY EXAM  Never done   HIV Screening  Never done   Hepatitis C Screening  Never done   DTaP/Tdap/Td (1 - Tdap) Never done   PAP SMEAR-Modifier  Never done   Colonoscopy  Never done   COVID-19 Vaccine (3 - Pfizer risk series) 09/17/2019   MAMMOGRAM  05/15/2021   INFLUENZA VACCINE  01/01/2023    Colorectal cancer screening: Referral to GI placed patient states had completed at [place] 3 years ago . Pt aware the office will call re: appt.  Mammogram status: Ordered will complete at the [place] . Pt provided with contact info and advised to call to schedule appt.   Bone Density status: Ordered not of age . Pt provided with contact info and advised to call to schedule appt.  Lung Cancer Screening: (Low Dose CT Chest recommended if Age 46-80 years, 20 pack-year currently smoking OR have quit w/in 15years.) does not qualify.   Lung Cancer Screening Referral: n/a  Additional Screening:  Hepatitis C Screening: does not qualify;   Vision Screening: Recommended annual ophthalmology exams for early detection of glaucoma and other disorders of the eye. Is the patient up to date with their annual eye exam?  No  Who is  the provider or what [facility] the [facility]  If pt is not established with a provider, would they like to be referred to a provider to establish care? No .   Dental Screening: Recommended annual dental exams for proper oral hygiene   Community Resource Referral / Chronic Care Management: CRR required this visit?  No   CCM required this visit?  No     Plan:     I have personally reviewed and noted the following in the patient's chart:   Medical and social history Use of alcohol, tobacco or illicit drugs  Current medications and supplements including opioid prescriptions. Patient is not currently taking opioid prescriptions. Functional ability and status Nutritional status Physical activity Advanced directives List of other physicians Hospitalizations, surgeries, and ER visits in previous 12 months Vitals Screenings to include cognitive, depression, and falls Referrals and appointments  In addition, I have reviewed and discussed with patient certain preventive protocols, quality metrics, and best practice recommendations. A written personalized care plan for preventive services as well as general preventive health recommendations were provided to patient.     Lorrene Reid, LPN   4/0/9811   After Visit Summary: (MyChart) Due to this being a telephonic visit, the after visit summary with patients personalized plan was offered to patient via MyChart   Nurse Notes: [facility]

## 2023-01-06 NOTE — Progress Notes (Signed)
Subjective:    Patient ID: Lisa Boyer, female    DOB: 06-08-1970, 52 y.o.   MRN: [id number]  Chief Complaint  Patient presents with   ER follow up    Hypertension   PT presents to the office today for ER follow up. She had a dental appointment yesterday and was told her BP was elevated to 174/102. She was very nervous and went to the ER.  Her BP in the ER was 169/106.   She has hx of DM and insomnia. She is very anxious today.   She is taking metoprolol 25 mg daily and currently taking 0.5 tab of her triamterene-hydrochlorothiazide 75-50 mg.  Hypertension This is a chronic problem. The current episode started more than 1 year ago. The problem has been waxing and waning since onset. The problem is uncontrolled. Pertinent negatives include no malaise/fatigue, peripheral edema or shortness of breath. Risk factors for coronary artery disease include dyslipidemia, obesity and sedentary lifestyle. The current treatment provides mild improvement.      Review of Systems  Constitutional:  Negative for malaise/fatigue.  Respiratory:  Negative for shortness of breath.   All other systems reviewed and are negative.      Objective:   Physical Exam Vitals reviewed.  Constitutional:      General: She is not in acute distress.    Appearance: She is well-developed.  HENT:     Head: Normocephalic and atraumatic.     Right Ear: Tympanic membrane normal.     Left Ear: Tympanic membrane normal.  Eyes:     Pupils: Pupils are equal, round, and reactive to light.  Neck:     Thyroid: No thyromegaly.  Cardiovascular:     Rate and Rhythm: Normal rate and regular rhythm.     Heart sounds: Normal heart sounds. No murmur heard. Pulmonary:     Effort: Pulmonary effort is normal. No respiratory distress.     Breath sounds: Normal breath sounds. No wheezing.  Abdominal:     General: Bowel sounds are normal. There is no distension.     Palpations: Abdomen is soft.     Tenderness: There is no  abdominal tenderness.  Musculoskeletal:        General: No tenderness. Normal range of motion.     Cervical back: Normal range of motion and neck supple.  Skin:    General: Skin is warm and dry.  Neurological:     Mental Status: She is alert and oriented to person, place, and time.     Cranial Nerves: No cranial nerve deficit.     Deep Tendon Reflexes: Reflexes are normal and symmetric.  Psychiatric:        Behavior: Behavior normal.        Thought Content: Thought content normal.        Judgment: Judgment normal.      BP (!) 153/89   Pulse 70   Temp (!) 97.4 F (36.3 C) (Temporal)   Ht 5\' 9"  (1.753 m)   Wt 233 lb 9.6 oz (106 kg)   LMP 06/22/2016   SpO2 99%   BMI 34.50 kg/m       Assessment & Plan:  Lisa Boyer comes in today with chief complaint of ER follow up  and Hypertension   Diagnosis and orders addressed:  1. Hypertension associated with diabetes (HCC) - amLODipine (NORVASC) 5 MG tablet; Take 1 tablet (5 mg total) by mouth daily.  Dispense: 90 tablet; Refill: 1  2. Hospital discharge follow-up -  Hospital notes reviewed  3. Anxiety   Pt will increase her current Maxzide 75-50 mg to 1 tab from 0.5 tab.  I have sent in Norvasc but patient was anxious about this and wants to try increasing her fluid medication first.  Low salt diet  Stress management  She will monitor her BP at home and if >140/90 will start Norvasc 5 mg.  She will follow up with PCP in 2 weeks.   Jannifer Rodney, FNP

## 2023-01-12 ENCOUNTER — Telehealth: Payer: Self-pay | Admitting: *Deleted

## 2023-01-12 ENCOUNTER — Telehealth: Payer: Self-pay

## 2023-01-12 NOTE — Progress Notes (Signed)
  Care Coordination  Outreach Note  01/12/2023 Name: Lisa Boyer MRN: [id number] DOB: 02/25/1971   Care Coordination Outreach Attempts: An unsuccessful telephone outreach was attempted today to offer the patient information about available care coordination services.  Follow Up Plan:  Additional outreach attempts will be made to offer the patient care coordination information and services.   Encounter Outcome:  No Answer  Burman Nieves, CCMA Care Coordination Care Guide Direct Dial: [phone]

## 2023-01-12 NOTE — Progress Notes (Signed)
  Care Coordination   Note   01/12/2023 Name: Karina Brasseur MRN: [id number] DOB: Feb 13, 1971  Alexya Beldin is a 52 y.o. year old female who sees [name], DO for primary care. I reached out to [name] by phone today to offer care coordination services.  Ms. Scheibel was given information about Care Coordination services today including:   The Care Coordination services include support from the care team which includes your Nurse Coordinator, Clinical Social Worker, or Pharmacist.  The Care Coordination team is here to help remove barriers to the health concerns and goals most important to you. Care Coordination services are voluntary, and the patient may decline or stop services at any time by request to their care team member.   Care Coordination Consent Status: Patient agreed to services and verbal consent obtained.   Follow up plan:  Telephone appointment with care coordination team member scheduled for:  02/04/2023  Encounter Outcome:  Pt. Scheduled  Burman Nieves, CCMA Care Coordination Care Guide Direct Dial: [phone]

## 2023-01-12 NOTE — Telephone Encounter (Signed)
Transition Care Management Unsuccessful Follow-up Telephone Call  Date of discharge and from where:  [facility] 8/5  Attempts:  2nd Attempt  Reason for unsuccessful TCM follow-up call:  No answer/busy   Lenard Forth [facility] Guide, [facility] [phone] [address] [place], [place], [place] [address] Phone: [phone] Email: [email or web address]

## 2023-01-12 NOTE — Telephone Encounter (Signed)
Transition Care Management Unsuccessful Follow-up Telephone Call  Date of discharge and from where:  [facility]  Attempts:  1st Attempt  Reason for unsuccessful TCM follow-up call:  No answer/busy   Lenard Forth [facility] Guide, [facility] [phone] [address] [place], [place], [place] [address] Phone: [phone] Email: [email or web address]

## 2023-01-21 ENCOUNTER — Encounter: Payer: Self-pay | Admitting: Family Medicine

## 2023-01-21 ENCOUNTER — Ambulatory Visit (INDEPENDENT_AMBULATORY_CARE_PROVIDER_SITE_OTHER): Payer: Medicare HMO | Admitting: Family Medicine

## 2023-01-21 VITALS — BP 134/80 | HR 69 | Temp 96.4°F | Ht 69.0 in | Wt 232.2 lb

## 2023-01-21 DIAGNOSIS — I152 Hypertension secondary to endocrine disorders: Secondary | ICD-10-CM | POA: Diagnosis not present

## 2023-01-21 DIAGNOSIS — E1159 Type 2 diabetes mellitus with other circulatory complications: Secondary | ICD-10-CM

## 2023-01-21 MED ORDER — TRIAMTERENE-HCTZ 75-50 MG PO TABS
1.0000 | ORAL_TABLET | Freq: Every day | ORAL | 1 refills | Status: DC
Start: 2023-01-21 — End: 2023-06-16

## 2023-01-21 NOTE — Progress Notes (Signed)
Subjective: CC: Follow-up hypertension PCP: [name], DO [id number] Lisa Boyer is a 52 y.o. female presenting to clinic today for:  1.  Hypertension associated with type 2 diabetes Patient was seen few weeks ago by a partner of mine and blood pressures were noted to be elevated.  She notes that she actually had some elevated blood pressures prior to a visit with her dentist.  She does not recall feeling especially anxious because there was no procedure going to be done but her blood pressure did not go back down into normal range.  She was prescribed Norvasc 5 mg but upon further discussion she did not start medicine.  She simply increased her Maxide back to 1 tablet daily of the 75-50 mg dosage.  She has not check blood pressures at home because she has been too nervous to do so.  She denies any chest pain, shortness of breath, dizziness, visual disturbance or edema.   ROS: Per HPI  Allergies  Allergen Reactions   Latex Other (See Comments)    Discolor-stay for while Adhesive tape - leaves a mark on skin   Past Medical History:  Diagnosis Date   Allergy    Anemia    Anxiety    Back pain 04/1992   chronic back pain-diagnose from [place]   Depression    Headache    frequent headache   Hypertension    PTSD (post-traumatic stress disorder)    Thyroid disease    Uterine fibroid     Current Outpatient Medications:    amLODipine (NORVASC) 5 MG tablet, Take 1 tablet (5 mg total) by mouth daily., Disp: 90 tablet, Rfl: 1   Biotin w/ Vitamins C & E (HAIR/SKIN/NAILS PO), Take by mouth., Disp: , Rfl:    cetirizine (ZYRTEC) 10 MG tablet, Take 10 mg by mouth at bedtime., Disp: , Rfl:    Cholecalciferol (VITAMIN D) 2000 UNITS tablet, Take 4,000 Units by mouth at bedtime. , Disp: , Rfl:    ciclopirox (PENLAC) 8 % solution, Apply topically at bedtime. Apply over nail and surrounding skin. Apply daily over previous coat. After seven (7) days, may remove with alcohol and continue cycle.,  Disp: 6.6 mL, Rfl: 0   clotrimazole (LOTRIMIN) 1 % cream, Apply 1 Application topically 2 (two) times daily., Disp: 30 g, Rfl: 0   Efinaconazole 10 % SOLN, Apply 1 drop topically daily., Disp: 4 mL, Rfl: 11   FLUoxetine (PROZAC) 20 MG capsule, Take 1 capsule (20 mg total) by mouth daily. (NEEDS TO BE SEEN BEFORE NEXT REFILL), Disp: 90 capsule, Rfl: 0   Insulin Pen Needle (NOVOFINE PLUS PEN NEEDLE) 32G X 4 MM MISC, UAD to inject Saxenda daily, Disp: 100 each, Rfl: 3   ketoconazole (NIZORAL) 2 % shampoo, Apply 1 application topically as needed for irritation., Disp: , Rfl:    metoprolol succinate (TOPROL-XL) 25 MG 24 hr tablet, Take 1 tablet (25 mg total) by mouth daily. (Patient taking differently: Take 12.5 mg by mouth daily.), Disp: 90 tablet, Rfl: 3   naproxen sodium (ALEVE) 220 MG tablet, Take 220 mg by mouth daily as needed (Back pain)., Disp: , Rfl:    ondansetron (ZOFRAN-ODT) 4 MG disintegrating tablet, Take 1 tablet (4 mg total) by mouth every 8 (eight) hours as needed for nausea or vomiting., Disp: 20 tablet, Rfl: 0   OZEMPIC, 0.25 OR 0.5 MG/DOSE, 2 MG/3ML SOPN, Inject 0.5 mg into the skin every 7 (seven) days., Disp: 9 mL, Rfl: 3   polyethylene glycol  powder (GLYCOLAX/MIRALAX) 17 GM/SCOOP powder, Mix 1 capful in 8 ounces of water and drink up to 2 times daily as needed for constipation., Disp: 255 g, Rfl: 1   tretinoin (RETIN-A) 0.05 % cream, Apply 1 application topically 3 (three) times a week. , Disp: , Rfl:    triamterene-hydrochlorothiazide (MAXZIDE) 75-50 MG tablet, Take 0.5 tablets by mouth daily., Disp: 45 tablet, Rfl: 1 Social History   Socioeconomic History   Marital status: Widowed    Spouse name: Not on file   Number of children: 1   Years of education: Not on file   Highest education level: Not on file  Occupational History   Occupation: Child psychotherapist  Tobacco Use   Smoking status: Never   Smokeless tobacco: Never  Vaping Use   Vaping status: Never Used  Substance and  Sexual Activity   Alcohol use: Not Currently    Comment: social-twice a year   Drug use: No   Sexual activity: Not on file  Other Topics Concern   Not on file  Social History Narrative   Son lives nearby and visits daily   Social Determinants of Health   Financial Resource Strain: Low Risk  (01/06/2023)   Overall Financial Resource Strain (CARDIA)    Difficulty of Paying Living Expenses: Not hard at all  Food Insecurity: No Food Insecurity (01/06/2023)   Hunger Vital Sign    Worried About Running Out of Food in the Last Year: Never true    Ran Out of Food in the Last Year: Never true  Transportation Needs: No Transportation Needs (01/06/2023)   PRAPARE - Administrator, Civil Service (Medical): No    Lack of Transportation (Non-Medical): No  Physical Activity: Insufficiently Active (01/06/2023)   Exercise Vital Sign    Days of Exercise per Week: 3 days    Minutes of Exercise per Session: 30 min  Stress: No Stress Concern Present (01/06/2023)   [employer] of Occupational Health - Occupational Stress Questionnaire    Feeling of Stress : Not at all  Social Connections: Moderately Integrated (01/06/2023)   Social Connection and Isolation Panel [NHANES]    Frequency of Communication with Friends and Family: More than three times a week    Frequency of Social Gatherings with Friends and Family: More than three times a week    Attends Religious Services: More than 4 times per year    Active Member of [employer] or Organizations: Yes    Attends Banker Meetings: More than 4 times per year    Marital Status: Widowed  Intimate Partner Violence: Not At Risk (01/06/2023)   Humiliation, Afraid, Rape, and Kick questionnaire    Fear of Current or Ex-Partner: No    Emotionally Abused: No    Physically Abused: No    Sexually Abused: No   Family History  Adopted: Yes  Problem Relation Age of Onset   Breast cancer Mother        and 2 other types but don't know what kind    Skin cancer Mother     Objective: Office vital signs reviewed. BP 134/80   Pulse 69   Temp (!) 96.4 F (35.8 C)   Ht 5\' 9"  (1.753 m)   Wt 232 lb 3.2 oz (105.3 kg)   LMP 06/22/2016   SpO2 94%   BMI 34.29 kg/m   Physical Examination:  General: Awake, alert, well nourished, No acute distress HEENT: sclera white, MMM Cardio: regular rate and rhythm, S1S2 heard,  no murmurs appreciated Pulm: clear to auscultation bilaterally, no wheezes, rhonchi or rales; normal work of breathing on room air   Assessment/ Plan: 52 y.o. female   Hypertension associated with diabetes (HCC) - Plan: triamterene-hydrochlorothiazide (MAXZIDE) 75-50 MG tablet  Blood pressure at goal.  No further interventions needed.  I will discontinue amlodipine as she has achieved this goal with max dose Maxide.  I will adjust the dosing on the prescription to reflect current usage.  She may follow-up her normal interval for A1c recheck etc.   [name], DO [facility] [phone]

## 2023-02-04 ENCOUNTER — Encounter: Payer: Self-pay | Admitting: *Deleted

## 2023-02-04 ENCOUNTER — Ambulatory Visit: Payer: Self-pay | Admitting: *Deleted

## 2023-02-04 NOTE — Patient Outreach (Signed)
Care Coordination   Initial Visit Note   02/04/2023  Name: Lisa Boyer MRN: [id number] DOB: 1971/05/11  Lisa Boyer is a 52 y.o. year old female who sees Lisa Ip, DO for primary care. I spoke with Lisa Boyer by phone today.  What matters to the patients health and wellness today?  Receive Counseling & Supportive Services.    Goals Addressed               This Visit's Progress     Receive Counseling & Supportive Services. (pt-stated)   On track     Care Coordination Interventions:  Interventions Today    Flowsheet Row Most Recent Value  Chronic Disease   Chronic disease during today's visit Diabetes, Hypertension (HTN), Other  [Adjustment Disorder with Mixed Anxiety and Depressed Mood, Morbid Obesity, Post-Traumatic Stress Disorder, Spider Bite]  General Interventions   General Interventions Discussed/Reviewed General Interventions Discussed, Labs, Doctor Visits, Vaccines, Referral to Nurse, Communication with, Level of Care, [employer], [employer] Exam, General Interventions Reviewed, Horticulturist, commercial (DME), Lipid Profile, Annual Foot Exam, Health Screening  [Encouraged]  Labs Hgb A1c every 3 months, Kidney Function  [Encouraged]  Vaccines COVID-19, Flu, Pneumonia, RSV, Shingles, Tetanus/Pertussis/Diphtheria  [Encouraged]  Doctor Visits Discussed/Reviewed Doctor Visits Discussed, Specialist, Doctor Visits Reviewed, Annual Wellness Visits, PCP  [Encouraged]  Health Screening Bone Density, Colonoscopy, Mammogram  [Encouraged]  Durable Medical Equipment (DME) --  [N/A]  PCP/Specialist Visits Compliance with follow-up visit  [Encouraged]  Communication with PCP/Specialists, RN, Pharmacists  [Encouraged]  Level of Care Applications, Personal Care Services  [Encouraged]  Applications Medicaid, Personal Care Services  [Encouraged]  Exercise Interventions   Exercise Discussed/Reviewed Exercise Discussed, Assistive device use and maintanence,  Exercise Reviewed, Physical Activity, Weight Managment  [Encouraged]  Physical Activity Discussed/Reviewed Physical Activity Discussed, Home Exercise Program (HEP), Gym, PREP, Types of exercise, Physical Activity Reviewed  [Encouraged]  Weight Management Weight loss  [Encouraged]  Education Interventions   Education Provided Provided Therapist, sports, Provided Web-based Education, Provided Education  [Encouraged]  Provided Engineer, petroleum On Nutrition, Mental Health/Coping with Illness, When to see the doctor, [employer], [employer], Medication, Exercise, Blood Sugar Monitoring, Applications, Labs, Eye Care, Foot Care  [Encouraged]  Labs Reviewed --  [N/A]  Applications Medicaid, Personal Care Services  [Encouraged]  Mental Health Interventions   Mental Health Discussed/Reviewed Mental Health Discussed, Anxiety, Depression, Grief and Loss, Mental Health Reviewed, Coping Strategies, Suicide, Substance Abuse, Crisis, Other  [Domestic Violence]  Nutrition Interventions   Nutrition Discussed/Reviewed Nutrition Discussed, Adding fruits and vegetables, Increasing proteins, Nutrition Reviewed, Decreasing fats, Decreasing salt, Decreasing sugar intake, Carbohydrate meal planning, Portion sizes, Fluid intake  [Encouraged]  Pharmacy Interventions   Pharmacy Dicussed/Reviewed Pharmacy Topics Discussed, Medications and their functions, Medication Adherence, Pharmacy Topics Reviewed, Affording Medications  [Encouraged]  Medication Adherence --  [N/A]  Safety Interventions   Safety Discussed/Reviewed Safety Discussed, Safety Reviewed  [Encouraged]  Advanced Directive Interventions   Advanced Directives Discussed/Reviewed Advanced Directives Discussed, Advanced Directives Reviewed  [Completed]      Assessed Social Determinant of Health Barriers. Discussed Plans for Ongoing Care Management Follow Up. Provided Careers information officer Information for Care Management Team Members. Screened for Signs &  Symptoms of Depression, Related to Chronic Disease State.  PHQ2 & PHQ9 Depression Screen Completed & Results Reviewed.  Suicidal Ideation & Homicidal Ideation Assessed - None Present.   Domestic Violence Assessed - None Present. Access to Weapons Assessed - None Present.   Active Listening & Reflection Utilized.  Verbalization  of Feelings Encouraged.  Emotional Support Provided. Feelings of Anxiety & Stress Validated. Symptoms of Post-Traumatic Stress Disorder Acknowledged. Symptoms of Adjustment Disorder with Mixed Anxiety & Depressed Mood Discussed. Mental Health Resources Reviewed & Mailed. Mental Health Support Groups Reviewed & Mailed. Caregiver Support Groups Reviewed & Mailed. Self-Enrollment in Caregiver Support Group of Interest Emphasized, from List Provided. Crisis Support Information, Agencies, Services, & Resources Discussed. Problem Solving Interventions Identified. Task-Centered Solutions Implemented.   Solution-Focused Strategies Developed. Acceptance & Commitment Therapy Introduced. Brief Cognitive Behavioral Therapy Initiated. Client-Centered Therapy Enacted. Quality of Sleep Assessed & Sleep Hygiene Techniques Promoted. Reviewed Prescription Medications & Discussed Importance of Compliance. Encouraged Administration of Medications, Exactly as Prescribed. Encouraged Increased Level of Activity & Exercise, as Tolerated. Encouraged Implementation of Deep Breathing Exercises, Relaxation Techniques, & Mindfulness Meditation Strategies Daily. Encouraged Consideration of Higher Level of Care Placement Options (I.e. Assisted Living, Senior Living, Intermediate Care, Extended Care, Etc.). Encouraged Consideration of Applying for Aid & Attendance Benefits through [facility] [phone]), Explaining Eligibility Criteria & Application Process. Encouraged Continued Involvement with Weekly Post-Traumatic Stress Disorder Support Group, Held Every Thursday  at 10:00 AM at [facility] [phone]). Encouraged Contact with CSW (# [phone]) if You Have Questions, Need Assistance, or If Additional Social Work Needs Are Identified Between Now & Out Next Follow-Up Outreach Call, Scheduled on 02/18/2023 at 11:15 AM.        SDOH assessments and interventions completed:  Yes.  SDOH Interventions Today    Flowsheet Row Most Recent Value  SDOH Interventions   Food Insecurity Interventions Intervention Not Indicated  Housing Interventions Intervention Not Indicated  Transportation Interventions Intervention Not Indicated, Patient Resources (Friends/Family)  Utilities Interventions Intervention Not Indicated  Alcohol Usage Interventions Intervention Not Indicated (Score <7)  Financial Strain Interventions Intervention Not Indicated  Physical Activity Interventions Intervention Not Indicated  Stress Interventions Intervention Not Indicated, Offered [employer], Provide Counseling, Other (Comment)  [Provided Counseling Services & Resources]  Social Connections Interventions Intervention Not Indicated  Health Literacy Interventions Intervention Not Indicated     Care Coordination Interventions:  Yes, provided.   Follow up plan: Follow up call scheduled for 02/18/2023 at 11:15 am.  Encounter Outcome:  Patient Visit Completed.   [name], BSW, MSW, Printmaker Social Work Case Set designer Health  [facility], Population Health Direct Dial: [phone]  Fax: [phone] Email: [email or web address]

## 2023-02-04 NOTE — Patient Instructions (Signed)
Visit Information  Thank you for taking time to visit with me today. Please don't hesitate to contact me if I can be of assistance to you.   Following are the goals we discussed today:   Goals Addressed               This Visit's Progress     Receive Counseling & Supportive Services. (pt-stated)   On track     Care Coordination Interventions:  Interventions Today    Flowsheet Row Most Recent Value  Chronic Disease   Chronic disease during today's visit Diabetes, Hypertension (HTN), Other  [Adjustment Disorder with Mixed Anxiety and Depressed Mood, Morbid Obesity, Post-Traumatic Stress Disorder, Spider Bite]  General Interventions   General Interventions Discussed/Reviewed General Interventions Discussed, Labs, Doctor Visits, Vaccines, Referral to Nurse, Communication with, Level of Care, [facility], [employer] Exam, General Interventions Reviewed, Horticulturist, commercial (DME), Lipid Profile, Annual Foot Exam, Health Screening  [Encouraged]  Labs Hgb A1c every 3 months, Kidney Function  [Encouraged]  Vaccines COVID-19, Flu, Pneumonia, RSV, Shingles, Tetanus/Pertussis/Diphtheria  [Encouraged]  Doctor Visits Discussed/Reviewed Doctor Visits Discussed, Specialist, Doctor Visits Reviewed, Annual Wellness Visits, PCP  [Encouraged]  Health Screening Bone Density, Colonoscopy, Mammogram  [Encouraged]  Durable Medical Equipment (DME) --  [N/A]  PCP/Specialist Visits Compliance with follow-up visit  [Encouraged]  Communication with PCP/Specialists, RN, Pharmacists  [Encouraged]  Level of Care Applications, Personal Care Services  [Encouraged]  Applications Medicaid, Personal Care Services  [Encouraged]  Exercise Interventions   Exercise Discussed/Reviewed Exercise Discussed, Assistive device use and maintanence, Exercise Reviewed, Physical Activity, Weight Managment  [Encouraged]  Physical Activity Discussed/Reviewed Physical Activity Discussed, Home Exercise Program (HEP), Gym,  PREP, Types of exercise, Physical Activity Reviewed  [Encouraged]  Weight Management Weight loss  [Encouraged]  Education Interventions   Education Provided Provided Therapist, sports, Provided Web-based Education, Provided Education  [Encouraged]  Provided Engineer, petroleum On Nutrition, Mental Health/Coping with Illness, When to see the doctor, [employer], [employer], Medication, Exercise, Blood Sugar Monitoring, Applications, Labs, Eye Care, Foot Care  [Encouraged]  Labs Reviewed --  [N/A]  Applications Medicaid, Personal Care Services  [Encouraged]  Mental Health Interventions   Mental Health Discussed/Reviewed Mental Health Discussed, Anxiety, Depression, Grief and Loss, Mental Health Reviewed, Coping Strategies, Suicide, Substance Abuse, Crisis, Other  [Domestic Violence]  Nutrition Interventions   Nutrition Discussed/Reviewed Nutrition Discussed, Adding fruits and vegetables, Increasing proteins, Nutrition Reviewed, Decreasing fats, Decreasing salt, Decreasing sugar intake, Carbohydrate meal planning, Portion sizes, Fluid intake  [Encouraged]  Pharmacy Interventions   Pharmacy Dicussed/Reviewed Pharmacy Topics Discussed, Medications and their functions, Medication Adherence, Pharmacy Topics Reviewed, Affording Medications  [Encouraged]  Medication Adherence --  [N/A]  Safety Interventions   Safety Discussed/Reviewed Safety Discussed, Safety Reviewed  [Encouraged]  Advanced Directive Interventions   Advanced Directives Discussed/Reviewed Advanced Directives Discussed, Advanced Directives Reviewed  [Completed]      Assessed Social Determinant of Health Barriers. Discussed Plans for Ongoing Care Management Follow Up. Provided Careers information officer Information for Care Management Team Members. Screened for Signs & Symptoms of Depression, Related to Chronic Disease State.  PHQ2 & PHQ9 Depression Screen Completed & Results Reviewed.  Suicidal Ideation & Homicidal Ideation Assessed -  None Present.   Domestic Violence Assessed - None Present. Access to Weapons Assessed - None Present.   Active Listening & Reflection Utilized.  Verbalization of Feelings Encouraged.  Emotional Support Provided. Feelings of Anxiety & Stress Validated. Symptoms of Post-Traumatic Stress Disorder Acknowledged. Symptoms of Adjustment Disorder with Mixed Anxiety &  Depressed Mood Discussed. Mental Health Resources Reviewed & Mailed. Mental Health Support Groups Reviewed & Mailed. Caregiver Support Groups Reviewed & Mailed. Self-Enrollment in Caregiver Support Group of Interest Emphasized, from List Provided. Crisis Support Information, Agencies, Services, & Resources Discussed. Problem Solving Interventions Identified. Task-Centered Solutions Implemented.   Solution-Focused Strategies Developed. Acceptance & Commitment Therapy Introduced. Brief Cognitive Behavioral Therapy Initiated. Client-Centered Therapy Enacted. Quality of Sleep Assessed & Sleep Hygiene Techniques Promoted. Reviewed Prescription Medications & Discussed Importance of Compliance. Encouraged Administration of Medications, Exactly as Prescribed. Encouraged Increased Level of Activity & Exercise, as Tolerated. Encouraged Implementation of Deep Breathing Exercises, Relaxation Techniques, & Mindfulness Meditation Strategies Daily. Encouraged Consideration of Higher Level of Care Placement Options (I.e. Assisted Living, Senior Living, Intermediate Care, Extended Care, Etc.). Encouraged Consideration of Applying for Aid & Attendance Benefits through [facility] [phone]), Explaining Eligibility Criteria & Application Process. Encouraged Continued Involvement with Weekly Post-Traumatic Stress Disorder Support Group, Held Every Thursday at 10:00 AM at [facility] [phone]). Encouraged Contact with CSW (# [phone]) if You Have  Questions, Need Assistance, or If Additional Social Work Needs Are Identified Between Now & Out Next Follow-Up Outreach Call, Scheduled on 02/18/2023 at 11:15 AM.      Our next appointment is by telephone on 02/18/2023 at 11:15 am.  Please call the care guide team at [phone] if you need to cancel or reschedule your appointment.   If you are experiencing a Mental Health or Behavioral Health Crisis or need someone to talk to, please call the Suicide and Crisis Lifeline: 988 call the [place] National Suicide Prevention Lifeline: 304-311-3217 or TTY: 224 757 6338 TTY 347 698 2908) to talk to a trained counselor call 1-800-273-TALK (toll free, 24 hour hotline) go to [facility] [address] [place] [phone]) call the [facility] Crisis Line: [phone] call 911  Patient verbalizes understanding of instructions and care plan provided today and agrees to view in MyChart. Active MyChart status and patient understanding of how to access instructions and care plan via MyChart confirmed with patient.     Telephone follow up appointment with care management team member scheduled for:  02/18/2023 at 11:15 am.  [name], BSW, MSW, LCSW  Embedded Practice Social Work Case Manager  [facility], Population Health Direct Dial: [phone]  Fax: [phone] Email: [email or web address]

## 2023-02-17 ENCOUNTER — Ambulatory Visit (INDEPENDENT_AMBULATORY_CARE_PROVIDER_SITE_OTHER): Payer: Medicare HMO

## 2023-02-17 DIAGNOSIS — I152 Hypertension secondary to endocrine disorders: Secondary | ICD-10-CM

## 2023-02-17 DIAGNOSIS — E1159 Type 2 diabetes mellitus with other circulatory complications: Secondary | ICD-10-CM | POA: Diagnosis not present

## 2023-02-17 DIAGNOSIS — Z23 Encounter for immunization: Secondary | ICD-10-CM

## 2023-02-17 LAB — HM DIABETES EYE EXAM

## 2023-02-17 NOTE — Progress Notes (Signed)
Lisa Boyer arrived 02/17/2023 and has given verbal consent to obtain images and complete their overdue diabetic retinal screening.  The images have been sent to an ophthalmologist or optometrist for review and interpretation.  Results will be sent back to [name], DO for review.  Patient has been informed they will be contacted when we receive the results via telephone or MyChart

## 2023-02-18 ENCOUNTER — Ambulatory Visit: Payer: Self-pay | Admitting: *Deleted

## 2023-02-18 NOTE — Patient Instructions (Signed)
Visit Information  Thank you for taking time to visit with me today. Please don't hesitate to contact me if I can be of assistance to you.   Following are the goals we discussed today:   Goals Addressed               This Visit's Progress     Receive Counseling & Supportive Services. (pt-stated)   On track     Care Coordination Interventions:  Interventions Today    Flowsheet Row Most Recent Value  Chronic Disease   Chronic disease during today's visit Diabetes, Hypertension (HTN), Other  [Adjustment Disorder with Mixed Anxiety and Depressed Mood, Morbid Obesity, Post-Traumatic Stress Disorder, Spider Bite]  General Interventions   General Interventions Discussed/Reviewed General Interventions Discussed, Labs, Doctor Visits, Vaccines, Referral to Nurse, Communication with, Level of Care, [employer], [employer] Exam, General Interventions Reviewed, Horticulturist, commercial (DME), Lipid Profile, Annual Foot Exam, Health Screening  [Encouraged]  Labs Hgb A1c every 3 months, Kidney Function  [Encouraged]  Vaccines COVID-19, Flu, Pneumonia, RSV, Shingles, Tetanus/Pertussis/Diphtheria  [Encouraged]  Doctor Visits Discussed/Reviewed Doctor Visits Discussed, Specialist, Doctor Visits Reviewed, Annual Wellness Visits, PCP  [Encouraged]  Health Screening Bone Density, Colonoscopy, Mammogram  [Encouraged]  Durable Medical Equipment (DME) --  [N/A]  PCP/Specialist Visits Compliance with follow-up visit  [Encouraged]  Communication with PCP/Specialists, RN, Pharmacists  [Encouraged]  Level of Care Applications, Personal Care Services  [Encouraged]  Applications Medicaid, Personal Care Services  [Encouraged]  Exercise Interventions   Exercise Discussed/Reviewed Exercise Discussed, Assistive device use and maintanence, Exercise Reviewed, Physical Activity, Weight Managment  [Encouraged]  Physical Activity Discussed/Reviewed Physical Activity Discussed, Home Exercise Program (HEP), Gym,  PREP, Types of exercise, Physical Activity Reviewed  [Encouraged]  Weight Management Weight loss  [Encouraged]  Education Interventions   Education Provided Provided Therapist, sports, Provided Web-based Education, Provided Education  [Encouraged]  Provided Engineer, petroleum On Nutrition, Mental Health/Coping with Illness, When to see the doctor, [employer], [employer], Medication, Exercise, Blood Sugar Monitoring, Applications, Labs, Eye Care, Foot Care  [Encouraged]  Labs Reviewed --  [N/A]  Applications Medicaid, Personal Care Services  [Encouraged]  Mental Health Interventions   Mental Health Discussed/Reviewed Mental Health Discussed, Anxiety, Depression, Grief and Loss, Mental Health Reviewed, Coping Strategies, Suicide, Substance Abuse, Crisis, Other  [Domestic Violence]  Nutrition Interventions   Nutrition Discussed/Reviewed Nutrition Discussed, Adding fruits and vegetables, Increasing proteins, Nutrition Reviewed, Decreasing fats, Decreasing salt, Decreasing sugar intake, Carbohydrate meal planning, Portion sizes, Fluid intake  [Encouraged]  Pharmacy Interventions   Pharmacy Dicussed/Reviewed Pharmacy Topics Discussed, Medications and their functions, Medication Adherence, Pharmacy Topics Reviewed, Affording Medications  [Encouraged]  Medication Adherence --  [N/A]  Safety Interventions   Safety Discussed/Reviewed Safety Discussed, Safety Reviewed  [Encouraged]  Advanced Directive Interventions   Advanced Directives Discussed/Reviewed Advanced Directives Discussed, Advanced Directives Reviewed  [Completed]      Active Listening & Reflection Utilized.  Verbalization of Feelings Encouraged.  Emotional Support Provided. Feelings of Anxiousness & Restlessness Validated. Symptoms of Anxiety, Depression, & Post-Traumatic Stress Disorder Acknowledged. Crisis Support Information, Agencies, Services, & Resources Revisited. Problem Solving Interventions  Activated. Task-Centered Solutions Employed.   Solution-Focused Strategies Indicated. Acceptance & Commitment Therapy Performed. Cognitive Behavioral Therapy Conducted. Client-Centered Therapy Initiated. Encouraged Engagement in Activities of Interest, Inside & Outside the Home. Encouraged Administration of Medications, Exactly as Prescribed. Encouraged Increased Level of Activity & Exercise, as Tolerated. Encouraged Daily Implementation of Deep Breathing Exercises, Relaxation Techniques, & Mindfulness Meditation Strategies. Confirmed Disinterest in Pursuing  Higher Level of Care Placement Options (I.e. Assisted Living, Senior Living, Intermediate Care, Extended Care, Etc.). Confirmed Disinterest in Applying for Aid & Attendance Benefits, through [facility] [phone]). Encouraged Self-Enrollment with Psychiatrist of Interest in [facility], from List Provided, to Receive Psychotropic Medication Administration & Management, in An Effort to Reduce & Manage Symptoms of Post-Traumatic Stress, Anxiety, & Depression. Encouraged Self-Enrollment with Therapist of Interest in [facility], from List Provided, to Receive Psychotherapeutic Counseling & Supportive Services, in An Effort to Reduce & Manage Symptoms of Post-Traumatic Stress, Anxiety, & Depression. Encouraged Weekly Attendance at Post-Traumatic Stress Disorder Support Group, Held Every Thursday at 10:00 AM at The [facility] & Human Services/[facility] Services [facility] [phone]), to Receive Counseling & Supportive Services, in An Effort to Reduce & Manage Symptoms of Post-Traumatic Stress, Anxiety, & Depression. Encouraged Contact with CSW (# [phone]) if You Have Questions, Need Assistance, or If Additional Social Work Needs Are Identified Between Now & Out Next Follow-Up Outreach Call, Scheduled on 03/05/2023 at 10:00 AM. Encouraged Attendance at Follow-Up Appointment with Dr. Delynn Flavin, Primary Care Provider with [facility] [phone]), Scheduled on 03/10/2023 at 2:00 PM.      Our next appointment is by telephone on 03/05/2023 at 10:00 am.  Please call the care guide team at [phone] if you need to cancel or reschedule your appointment.   If you are experiencing a Mental Health or Behavioral Health Crisis or need someone to talk to, please call the Suicide and Crisis Lifeline: 988 call the [place] National Suicide Prevention Lifeline: (406) 346-5058 or TTY: 3603753237 TTY 7165809898) to talk to a trained counselor call 1-800-273-TALK (toll free, 24 hour hotline) go to [facility] [address], [place] [phone]) call the [facility] Crisis Line: [phone] call 911  Patient verbalizes understanding of instructions and care plan provided today and agrees to view in MyChart. Active MyChart status and patient understanding of how to access instructions and care plan via MyChart confirmed with patient.     Telephone follow up appointment with care management team member scheduled for:  03/05/2023 at 10:00 am.  [name], BSW, MSW, LCSW  Embedded Practice Social Work Case Manager  [facility], Population Health Direct Dial: [phone]  Fax: [phone] Email: [email or web address]: [email or web address]

## 2023-02-18 NOTE — Patient Outreach (Signed)
Care Coordination   Follow Up Visit Note   02/18/2023  Name: Persia Fifield MRN: [id number] DOB: 1970-07-26  Giulianna Krenke is a 52 y.o. year old female who sees [name], DO for primary care. I spoke with [name] by phone today.  What matters to the patients health and wellness today?  Receive Counseling & Supportive Services.    Goals Addressed               This Visit's Progress     Receive Counseling & Supportive Services. (pt-stated)   On track     Care Coordination Interventions:  Interventions Today    Flowsheet Row Most Recent Value  Chronic Disease   Chronic disease during today's visit Diabetes, Hypertension (HTN), Other  [Adjustment Disorder with Mixed Anxiety and Depressed Mood, Morbid Obesity, Post-Traumatic Stress Disorder, Spider Bite]  General Interventions   General Interventions Discussed/Reviewed General Interventions Discussed, Labs, Doctor Visits, Vaccines, Referral to Nurse, Communication with, Level of Care, [employer], [employer] Exam, General Interventions Reviewed, Horticulturist, commercial (DME), Lipid Profile, Annual Foot Exam, Health Screening  [Encouraged]  Labs Hgb A1c every 3 months, Kidney Function  [Encouraged]  Vaccines COVID-19, Flu, Pneumonia, RSV, Shingles, Tetanus/Pertussis/Diphtheria  [Encouraged]  Doctor Visits Discussed/Reviewed Doctor Visits Discussed, Specialist, Doctor Visits Reviewed, Annual Wellness Visits, PCP  [Encouraged]  Health Screening Bone Density, Colonoscopy, Mammogram  [Encouraged]  Durable Medical Equipment (DME) --  [N/A]  PCP/Specialist Visits Compliance with follow-up visit  [Encouraged]  Communication with PCP/Specialists, RN, Pharmacists  [Encouraged]  Level of Care Applications, Personal Care Services  [Encouraged]  Applications Medicaid, Personal Care Services  [Encouraged]  Exercise Interventions   Exercise Discussed/Reviewed Exercise Discussed, Assistive device use and maintanence,  Exercise Reviewed, Physical Activity, Weight Managment  [Encouraged]  Physical Activity Discussed/Reviewed Physical Activity Discussed, Home Exercise Program (HEP), Gym, PREP, Types of exercise, Physical Activity Reviewed  [Encouraged]  Weight Management Weight loss  [Encouraged]  Education Interventions   Education Provided Provided Therapist, sports, Provided Web-based Education, Provided Education  [Encouraged]  Provided Engineer, petroleum On Nutrition, Mental Health/Coping with Illness, When to see the doctor, [employer], [employer], Medication, Exercise, Blood Sugar Monitoring, Applications, Labs, Eye Care, Foot Care  [Encouraged]  Labs Reviewed --  [N/A]  Applications Medicaid, Personal Care Services  [Encouraged]  Mental Health Interventions   Mental Health Discussed/Reviewed Mental Health Discussed, Anxiety, Depression, Grief and Loss, Mental Health Reviewed, Coping Strategies, Suicide, Substance Abuse, Crisis, Other  [Domestic Violence]  Nutrition Interventions   Nutrition Discussed/Reviewed Nutrition Discussed, Adding fruits and vegetables, Increasing proteins, Nutrition Reviewed, Decreasing fats, Decreasing salt, Decreasing sugar intake, Carbohydrate meal planning, Portion sizes, Fluid intake  [Encouraged]  Pharmacy Interventions   Pharmacy Dicussed/Reviewed Pharmacy Topics Discussed, Medications and their functions, Medication Adherence, Pharmacy Topics Reviewed, Affording Medications  [Encouraged]  Medication Adherence --  [N/A]  Safety Interventions   Safety Discussed/Reviewed Safety Discussed, Safety Reviewed  [Encouraged]  Advanced Directive Interventions   Advanced Directives Discussed/Reviewed Advanced Directives Discussed, Advanced Directives Reviewed  [Completed]      Active Listening & Reflection Utilized.  Verbalization of Feelings Encouraged.  Emotional Support Provided. Feelings of Anxiousness & Restlessness Validated. Symptoms of Anxiety, Depression, &  Post-Traumatic Stress Disorder Acknowledged. Crisis Support Information, Agencies, Services, & Resources Revisited. Problem Solving Interventions Activated. Task-Centered Solutions Employed.   Solution-Focused Strategies Indicated. Acceptance & Commitment Therapy Performed. Cognitive Behavioral Therapy Conducted. Client-Centered Therapy Initiated. Encouraged Engagement in Activities of Interest, Inside & Outside the Home. Encouraged Administration of Medications, Exactly as  Prescribed. Encouraged Increased Level of Activity & Exercise, as Tolerated. Encouraged Daily Implementation of Deep Breathing Exercises, Relaxation Techniques, & Mindfulness Meditation Strategies. Confirmed Disinterest in Pursuing Higher Level of Care Placement Options (I.e. Assisted Living, Senior Living, Intermediate Care, Extended Care, Etc.). Confirmed Disinterest in Applying for Aid & Attendance Benefits, through [facility] [phone]). Encouraged Self-Enrollment with Psychiatrist of Interest in [facility], from List Provided, to Receive Psychotropic Medication Administration & Management, in An Effort to Reduce & Manage Symptoms of Post-Traumatic Stress, Anxiety, & Depression. Encouraged Self-Enrollment with Therapist of Interest in [facility], from List Provided, to Receive Psychotherapeutic Counseling & Supportive Services, in An Effort to Reduce & Manage Symptoms of Post-Traumatic Stress, Anxiety, & Depression. Encouraged Weekly Attendance at Post-Traumatic Stress Disorder Support Group, Held Every Thursday at 10:00 AM at [facility] & Human Services/[facility] Services [facility] [phone]), to Receive Counseling & Supportive Services, in An Effort to Reduce & Manage Symptoms of Post-Traumatic Stress, Anxiety, & Depression. Encouraged Contact with CSW (# [phone]) if You Have Questions, Need Assistance, or If Additional Social Work Needs Are Identified  Between Now & Out Next Follow-Up Outreach Call, Scheduled on 03/05/2023 at 10:00 AM. Encouraged Attendance at Follow-Up Appointment with Dr. Delynn Flavin, Primary Care Provider with [facility] [phone]), Scheduled on 03/10/2023 at 2:00 PM.      SDOH assessments and interventions completed:  Yes.  Care Coordination Interventions:  Yes, provided.   Follow up plan: Follow up call scheduled for 03/05/2023 at 10:00 am.  Encounter Outcome:  Patient Visit Completed.   [name], BSW, MSW, Printmaker Social Work Case Set designer Health  [facility], Population Health Direct Dial: [phone]  Fax: [phone] Email: [email or web address] Website: [email or web address]

## 2023-02-26 NOTE — Patient Instructions (Signed)

## 2023-03-05 ENCOUNTER — Ambulatory Visit: Payer: Self-pay | Admitting: *Deleted

## 2023-03-05 NOTE — Patient Instructions (Signed)
Visit Information  Thank you for taking time to visit with me today. Please don't hesitate to contact me if I can be of assistance to you.   Following are the goals we discussed today:   Goals Addressed               This Visit's Progress     Receive Counseling & Supportive Services. 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, Post-Traumatic Stress Disorder, & Anxiety]  General Interventions   General Interventions Discussed/Reviewed General Interventions Discussed, General Interventions Reviewed, [employer], Communication with  [PCP]  Doctor Visits Discussed/Reviewed Doctor Visits Discussed, Doctor Visits Reviewed, PCP  [Reminded of PCP Appointment on 03/10/2023 at 2:00 PM]  Communication with PCP/Specialists  [Route Note Via Epic to PCP]  Exercise Interventions   Exercise Discussed/Reviewed Exercise Discussed, Exercise Reviewed, Physical Activity  [Encouraged]  Physical Activity Discussed/Reviewed Physical Activity Discussed, Physical Activity Reviewed  [Encouraged]  Education Interventions   Education Provided Provided Therapist, sports, Provided Web-based Education, Provided Education  [Provided Resources on Adjustment Disorder & Post-Traumatic Stress Disorder]  Provided Verbal Education On Mental Health/Coping with Illness, Exercise, Medication, Nutrition, Community Resources  [Encouraged]  Mental Health Interventions   Mental Health Discussed/Reviewed Mental Health Discussed, Mental Health Reviewed, Coping Strategies, Anxiety, Depression, Other  [Adjustment Disorder & Post-Traumatic Stress Disorder]  Nutrition Interventions   Nutrition Discussed/Reviewed Nutrition Discussed, Nutrition Reviewed, Carbohydrate meal planning  [Encouraged]  Pharmacy Interventions   Pharmacy  Dicussed/Reviewed Pharmacy Topics Discussed, Pharmacy Topics Reviewed, Medication Adherence  [Encouraged]      Active Listening & Reflection Utilized.  Verbalization of Feelings Encouraged.  Emotional Support Provided. Symptoms of Adjustment Disorder, Anxiety, Depression, & Post-Traumatic Stress Disorder Acknowledged. Problem Solving Interventions Employed. Task-Centered Solutions Implemented.   Solution-Focused Strategies Indicated. Cognitive Behavioral Therapy Initiated. Client-Centered Therapy Performed. Encouraged Journaling as A Means to Express Thoughts, Feelings, & Emotions. Encouraged Daily Implementation of Deep Breathing Exercises, Relaxation Techniques, & Mindfulness Meditation Strategies. Encouraged Continued Administration of Psychotropic Medications, Exactly as Prescribed. Encouraged Review of Educational Material on "Coping with Traumatic Stress Reactions", Emailed on 03/05/2023, & Be Prepared to Discuss During Next Scheduled Follow-Up Outreach Call Encouraged Review of Educational Material on "5 Approaches to Adjustment Disorder Treatment & Management: Nurturing Mental Resilience", Emailed on 03/05/2023, & Be Prepared to Discuss During Next Scheduled Follow-Up Outreach Call. Encouraged Weekly Attendance at Post-Traumatic Stress Disorder Support Group at [facility] & Human Services/[facility] Services [facility] [phone]), to Receive Counseling & Supportive Services, in An Effort to Reduce & Manage Symptoms of Adjustment Disorder, Anxiety, Depression, & Post-Traumatic Stress Disorder.  Encouraged Engagement with [name], Social Work Case Manager with [facility] [phone]), if You Have Questions, Need Assistance, or If Additional Social Work Needs Are Identified Between Now & Our Next Follow-Up Outreach Call, Scheduled on 04/07/2023 at 10:15 AM. Encouraged Attendance at Follow-Up Appointment with Dr. Delynn Flavin, Primary Care  Provider with [facility] [phone]), Scheduled on 03/10/2023 at 2:00 PM.      Our next appointment is by telephone on 04/07/2023 at 10:15 am.  Please call the care guide team at [phone] if you need to cancel or reschedule your appointment.   If you are experiencing a Mental Health or Behavioral Health Crisis or need someone to  talk to, please call the Suicide and Crisis Lifeline: 988 call the [place] National Suicide Prevention Lifeline: 318-801-0269 or TTY: 612-786-1287 TTY 812 231 6737) to talk to a trained counselor call 1-800-273-TALK (toll free, 24 hour hotline) go to [facility] [address], [place] [phone]) call the [facility] Crisis Line: [phone] call 911  Patient verbalizes understanding of instructions and care plan provided today and agrees to view in MyChart. Active MyChart status and patient understanding of how to access instructions and care plan via MyChart confirmed with patient.     Telephone follow up appointment with care management team member scheduled for:  04/07/2023 at 10:15 am.  [name], BSW, MSW, LCSW  Embedded Practice Social Work Case Manager  [facility], Population Health Direct Dial: [phone]  Fax: [phone] Email: [email or web address]: [email or web address]

## 2023-03-05 NOTE — Patient Outreach (Signed)
Care Coordination   Follow Up Visit Note   03/05/2023  Name: Lisa Boyer MRN: [id number] DOB: [birth year]  Lisa Boyer is a 51 y.o. year old female who sees Lisa Ip, DO for primary care. I spoke with Lisa Boyer by phone today.  What matters to the patients health and wellness today?  Receive Counseling & Supportive Services.    Goals Addressed               This Visit's Progress     Receive Counseling & Supportive Services. 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, Post-Traumatic Stress Disorder, & Anxiety]  General Interventions   General Interventions Discussed/Reviewed General Interventions Discussed, General Interventions Reviewed, [employer], Communication with  [PCP]  Doctor Visits Discussed/Reviewed Doctor Visits Discussed, Doctor Visits Reviewed, PCP  [Reminded of PCP Appointment on 03/10/2023 at 2:00 PM]  Communication with PCP/Specialists  [Route Note Via Epic to PCP]  Exercise Interventions   Exercise Discussed/Reviewed Exercise Discussed, Exercise Reviewed, Physical Activity  [Encouraged]  Physical Activity Discussed/Reviewed Physical Activity Discussed, Physical Activity Reviewed  [Encouraged]  Education Interventions   Education Provided Provided Therapist, sports, Provided Web-based Education, Provided Education  [Provided Resources on Adjustment Disorder & Post-Traumatic Stress Disorder]  Provided Verbal Education On Mental Health/Coping with Illness, Exercise, Medication, Nutrition, Community Resources  [Encouraged]  Mental Health Interventions   Mental Health Discussed/Reviewed Mental Health Discussed, Mental Health Reviewed, Coping Strategies, Anxiety, Depression, Other  [Adjustment Disorder & Post-Traumatic Stress Disorder]  Nutrition  Interventions   Nutrition Discussed/Reviewed Nutrition Discussed, Nutrition Reviewed, Carbohydrate meal planning  [Encouraged]  Pharmacy Interventions   Pharmacy Dicussed/Reviewed Pharmacy Topics Discussed, Pharmacy Topics Reviewed, Medication Adherence  [Encouraged]      Active Listening & Reflection Utilized.  Verbalization of Feelings Encouraged.  Emotional Support Provided. Symptoms of Adjustment Disorder, Anxiety, Depression, & Post-Traumatic Stress Disorder Acknowledged. Problem Solving Interventions Employed. Task-Centered Solutions Implemented.   Solution-Focused Strategies Indicated. Cognitive Behavioral Therapy Initiated. Client-Centered Therapy Performed. Encouraged Journaling as A Means to Express Thoughts, Feelings, & Emotions. Encouraged Daily Implementation of Deep Breathing Exercises, Relaxation Techniques, & Mindfulness Meditation Strategies. Encouraged Continued Administration of Psychotropic Medications, Exactly as Prescribed. Encouraged Review of Educational Material on "Coping with Traumatic Stress Reactions", Emailed on 03/05/2023, & Be Prepared to Discuss During Next Scheduled Follow-Up Outreach Call Encouraged Review of Educational Material on "5 Approaches to Adjustment Disorder Treatment & Management: Nurturing Mental Resilience", Emailed on 03/05/2023, & Be Prepared to Discuss During Next Scheduled Follow-Up Outreach Call. Encouraged Weekly Attendance at Post-Traumatic Stress Disorder Support Group at [facility] & Human Services/[facility] Services [facility] [phone]), to Receive Counseling & Supportive Services, in An Effort to Reduce & Manage Symptoms of Adjustment Disorder, Anxiety, Depression, & Post-Traumatic Stress Disorder.  Encouraged Engagement with Lisa Boyer, Social Work Case Manager with [facility] [phone]), if You Have Questions, Need Assistance, or If Additional Social Work Needs Are Identified Between  Now & Our Next Follow-Up Outreach Call, Scheduled on 04/07/2023 at 10:15 AM. Encouraged Attendance at Follow-Up Appointment with Dr. Delynn Flavin, Primary Care Provider with [facility] [phone]), Scheduled on 03/10/2023 at 2:00 PM.      SDOH assessments and interventions completed:  Yes.  Care Coordination Interventions:  Yes, provided.   Follow up plan:  Follow up call scheduled for 04/07/2023 at 10:15 am.  Encounter Outcome:  Patient Visit Completed.   Lisa Boyer, BSW, MSW, Printmaker Social Work Case Set designer Health  [facility], Population Health Direct Dial: [phone]  Fax: [phone] Email: [name]: [email or web address]

## 2023-03-10 ENCOUNTER — Ambulatory Visit: Payer: Medicare HMO | Admitting: Family Medicine

## 2023-03-10 ENCOUNTER — Encounter: Payer: Self-pay | Admitting: Family Medicine

## 2023-03-10 VITALS — BP 135/89 | HR 79 | Temp 97.5°F | Ht 69.0 in | Wt 237.6 lb

## 2023-03-10 DIAGNOSIS — I152 Hypertension secondary to endocrine disorders: Secondary | ICD-10-CM | POA: Diagnosis not present

## 2023-03-10 DIAGNOSIS — E1159 Type 2 diabetes mellitus with other circulatory complications: Secondary | ICD-10-CM

## 2023-03-10 DIAGNOSIS — E119 Type 2 diabetes mellitus without complications: Secondary | ICD-10-CM | POA: Diagnosis not present

## 2023-03-10 DIAGNOSIS — Z114 Encounter for screening for human immunodeficiency virus [HIV]: Secondary | ICD-10-CM

## 2023-03-10 DIAGNOSIS — Z1211 Encounter for screening for malignant neoplasm of colon: Secondary | ICD-10-CM

## 2023-03-10 DIAGNOSIS — Z0001 Encounter for general adult medical examination with abnormal findings: Secondary | ICD-10-CM

## 2023-03-10 DIAGNOSIS — E1169 Type 2 diabetes mellitus with other specified complication: Secondary | ICD-10-CM | POA: Diagnosis not present

## 2023-03-10 DIAGNOSIS — Z7985 Long-term (current) use of injectable non-insulin antidiabetic drugs: Secondary | ICD-10-CM | POA: Diagnosis not present

## 2023-03-10 DIAGNOSIS — Z7289 Other problems related to lifestyle: Secondary | ICD-10-CM | POA: Diagnosis not present

## 2023-03-10 DIAGNOSIS — Z1159 Encounter for screening for other viral diseases: Secondary | ICD-10-CM | POA: Diagnosis not present

## 2023-03-10 DIAGNOSIS — Z0000 Encounter for general adult medical examination without abnormal findings: Secondary | ICD-10-CM

## 2023-03-10 LAB — BAYER DCA HB A1C WAIVED: HB A1C (BAYER DCA - WAIVED): 5.7 % — ABNORMAL HIGH (ref 4.8–5.6)

## 2023-03-10 NOTE — Progress Notes (Signed)
Lisa Boyer is a 52 y.o. female presents to office today for annual physical exam examination.    Concerns today include: 1. Type 2 Diabetes with hypertension, hyperlipidemia:  Has been off Ozempic for about 2 weeks.  She is in the donut hole so she's been using samples only.  She never advanced to 0.5mg  weekly.  No GI side effects.  Has not been following a diet   Last eye exam: UTD Last foot exam: UTD Last A1c:  Lab Results  Component Value Date   HGBA1C 5.7 (H) 03/10/2023   Nephropathy screen indicated?: needs Last flu, zoster and/or pneumovax:  Immunization History  Administered Date(s) Administered   Influenza, Seasonal, Injecte, Preservative Fre 02/17/2023   Influenza,inj,Quad PF,6+ Mos 04/15/2021   Influenza-Unspecified 06/10/2020   PFIZER(Purple Top)SARS-COV-2 Vaccination 07/30/2019, 08/20/2019   Zoster Recombinant(Shingrix) 04/06/2020, 09/26/2020    ROS: Denies dizziness, LOC, polyuria, polydipsia, unintended weight loss/gain, foot ulcerations, numbness or tingling in extremities, shortness of breath or chest pain.   Marital status: single, Substance use: none. She will bring in [facility] in 2018. Mammo in 06/2022.  Both normal. Health Maintenance Due  Topic Date Due   HIV Screening  Never done   Hepatitis C Screening  Never done   DTaP/Tdap/Td (1 - Tdap) Never done   Colonoscopy  Never done   MAMMOGRAM  05/15/2021   HEMOGLOBIN A1C  03/10/2023   Refills needed today: all  Immunization History  Administered Date(s) Administered   Influenza, Seasonal, Injecte, Preservative Fre 02/17/2023   Influenza,inj,Quad PF,6+ Mos 04/15/2021   Influenza-Unspecified 06/10/2020   PFIZER(Purple Top)SARS-COV-2 Vaccination 07/30/2019, 08/20/2019   Zoster Recombinant(Shingrix) 04/06/2020, 09/26/2020   Past Medical History:  Diagnosis Date   Allergy    Anemia    Anxiety    Back pain 04/1992   chronic back  pain-diagnose from [place]   Depression    Headache    frequent headache   Hypertension    PTSD (post-traumatic stress disorder)    Thyroid disease    Uterine fibroid    Social History   Socioeconomic History   Marital status: Widowed    Spouse name: Not on file   Number of children: 1   Years of education: 25   Highest education level: 12th grade  Occupational History   Occupation: Child psychotherapist  Tobacco Use   Smoking status: Never    Passive exposure: Never   Smokeless tobacco: Never  Vaping Use   Vaping status: Never Used  Substance and Sexual Activity   Alcohol use: Not Currently    Comment: social-twice a year   Drug use: No   Sexual activity: Not Currently    Partners: Male    Birth control/protection: Surgical  Other Topics Concern   Not on file  Social History Narrative   Son lives nearby and visits daily   Social Determinants of Health   Financial Resource Strain: Low Risk  (02/04/2023)   Overall Financial Resource Strain (CARDIA)    Difficulty of Paying Living Expenses: Not hard at all  Food Insecurity: No Food Insecurity (02/04/2023)   Hunger Vital Sign    Worried About Running Out of Food in the Last Year: Never true    Ran Out of Food in the Last Year: Never true  Transportation Needs: No Transportation Needs (02/04/2023)   PRAPARE - Administrator, Civil Service (Medical): No    Lack of Transportation (Non-Medical): No  Physical Activity: Sufficiently Active (02/04/2023)   Exercise Vital Sign    Days of Exercise per Week: 5 days    Minutes of Exercise per Session: 50 min  Recent Concern: Physical Activity - Insufficiently Active (01/06/2023)   Exercise Vital Sign    Days of Exercise per Week: 3 days    Minutes of Exercise per Session: 30 min  Stress: No Stress Concern Present (02/04/2023)   [employer] of Occupational Health - Occupational Stress Questionnaire    Feeling of Stress : Only a little  Social Connections: Moderately Integrated  (02/04/2023)   Social Connection and Isolation Panel [NHANES]    Frequency of Communication with Friends and Family: More than three times a week    Frequency of Social Gatherings with Friends and Family: More than three times a week    Attends Religious Services: More than 4 times per year    Active Member of [employer] or Organizations: Yes    Attends Banker Meetings: More than 4 times per year    Marital Status: Widowed  Intimate Partner Violence: Not At Risk (02/04/2023)   Humiliation, Afraid, Rape, and Kick questionnaire    Fear of Current or Ex-Partner: No    Emotionally Abused: No    Physically Abused: No    Sexually Abused: No   Past Surgical History:  Procedure Laterality Date   ABDOMINAL HYSTERECTOMY  08/13/2017   left ovary removed   BREAST BIOPSY Bilateral 1888/1191/1994/2001   remove growth from both side breast   ENDOMETRIAL ABLATION     THYROIDECTOMY  08/1999   partial   Family History  Adopted: Yes  Problem Relation Age of Onset   Breast cancer Mother        and 2 other types but don't know what kind   Skin cancer Mother     Current Outpatient Medications:    Biotin w/ Vitamins C & E (HAIR/SKIN/NAILS PO), Take by mouth., Disp: , Rfl:    cetirizine (ZYRTEC) 10 MG tablet, Take 10 mg by mouth at bedtime., Disp: , Rfl:    Cholecalciferol (VITAMIN D) 2000 UNITS tablet, Take 4,000 Units by mouth at bedtime. , Disp: , Rfl:    ciclopirox (PENLAC) 8 % solution, Apply topically at bedtime. Apply over nail and surrounding skin. Apply daily over previous coat. After seven (7) days, may remove with alcohol and continue cycle., Disp: 6.6 mL, Rfl: 0   clotrimazole (LOTRIMIN) 1 % cream, Apply 1 Application topically 2 (two) times daily., Disp: 30 g, Rfl: 0   Efinaconazole 10 % SOLN, Apply 1 drop topically daily., Disp: 4 mL, Rfl: 11   FLUoxetine (PROZAC) 20 MG capsule, Take 1 capsule (20 mg total) by mouth daily. (NEEDS TO BE SEEN BEFORE NEXT REFILL), Disp: 90 capsule,  Rfl: 0   Insulin Pen Needle (NOVOFINE PLUS PEN NEEDLE) 32G X 4 MM MISC, UAD to inject Saxenda daily, Disp: 100 each, Rfl: 3   ketoconazole (NIZORAL) 2 % shampoo, Apply 1 application topically as needed for irritation., Disp: , Rfl:    metoprolol succinate (TOPROL-XL) 25 MG 24 hr tablet, Take 1 tablet (25 mg total) by mouth daily. (Patient taking differently: Take 12.5 mg by mouth daily.), Disp: 90 tablet, Rfl: 3   naproxen sodium (ALEVE) 220 MG tablet, Take 220 mg by mouth daily as needed (Back pain)., Disp: , Rfl:    ondansetron (ZOFRAN-ODT) 4 MG disintegrating tablet, Take 1 tablet (4 mg total) by mouth every 8 (eight) hours as needed for nausea  or vomiting., Disp: 20 tablet, Rfl: 0   OZEMPIC, 0.25 OR 0.5 MG/DOSE, 2 MG/3ML SOPN, Inject 0.5 mg into the skin every 7 (seven) days., Disp: 9 mL, Rfl: 3   polyethylene glycol powder (GLYCOLAX/MIRALAX) 17 GM/SCOOP powder, Mix 1 capful in 8 ounces of water and drink up to 2 times daily as needed for constipation., Disp: 255 g, Rfl: 1   tretinoin (RETIN-A) 0.05 % cream, Apply 1 application topically 3 (three) times a week. , Disp: , Rfl:    triamterene-hydrochlorothiazide (MAXZIDE) 75-50 MG tablet, Take 1 tablet by mouth daily., Disp: 90 tablet, Rfl: 1  Allergies  Allergen Reactions   Latex Other (See Comments)    Discolor-stay for while Adhesive tape - leaves a mark on skin     ROS: Review of Systems A comprehensive review of systems was negative except for: Behavioral/Psych: positive for anxiety and depression    Physical exam BP 135/89   Pulse 79   Temp (!) 97.5 F (36.4 C)   Ht 5\' 9"  (1.753 m)   Wt 237 lb 9.6 oz (107.8 kg)   LMP 06/22/2016   SpO2 100%   BMI 35.09 kg/m  General appearance: alert, cooperative, appears stated age, no distress, and moderately obese Head: Normocephalic, without obvious abnormality, atraumatic Eyes: negative findings: lids and lashes normal, conjunctivae and sclerae normal, corneas clear, and pupils equal,  round, reactive to light and accomodation Ears: normal TM's and external ear canals both ears Nose: Nares normal. Septum midline. Mucosa normal. No drainage or sinus tenderness. Throat: lips, mucosa, and tongue normal; teeth and gums normal Neck: no adenopathy, supple, symmetrical, trachea midline, and thyroid not enlarged, symmetric, no tenderness/mass/nodules Back: symmetric, no curvature. ROM normal. No CVA tenderness. Lungs: clear to auscultation bilaterally Heart: regular rate and rhythm, S1, S2 normal, no murmur, click, rub or gallop Abdomen: soft, non-tender; bowel sounds normal; no masses,  no organomegaly Extremities: extremities normal, atraumatic, no cyanosis or edema Pulses: 2+ and symmetric Skin: Skin color, texture, turgor normal. No rashes or lesions Lymph nodes: Cervical, supraclavicular, and axillary nodes normal. Neurologic: Grossly normal      03/10/2023    2:21 PM 03/10/2023    2:04 PM 02/04/2023    9:10 AM  Depression screen PHQ 2/9  Decreased Interest 0 0 0  Down, Depressed, Hopeless 0 0 1  PHQ - 2 Score 0 0 1  Altered sleeping   0  Tired, decreased energy   0  Change in appetite   0  Feeling bad or failure about yourself    1  Trouble concentrating   1  Moving slowly or fidgety/restless   0  Suicidal thoughts   0  PHQ-9 Score   3  Difficult doing work/chores   Not difficult at all      01/21/2023   11:44 AM 01/06/2023    8:46 AM 09/08/2022    3:43 PM 06/09/2022    2:52 PM  GAD 7 : Generalized Anxiety Score  Nervous, Anxious, on Edge 2 2 3 3   Control/stop worrying 2 3 3 3   Worry too much - different things 3 3 3 3   Trouble relaxing 3 3 3 3   Restless 0 1 3 3   Easily annoyed or irritable 0 1 3 2   Afraid - awful might happen 2 3 3 2   Total GAD 7 Score 12 16 21 19   Anxiety Difficulty Somewhat difficult Somewhat difficult Very difficult Very difficult     Assessment/ Plan: Lisa Boyer here for annual  physical exam.   Annual physical exam  Colon cancer  screening  Diabetes mellitus treated with injections of non-insulin medication (HCC) - Plan: Bayer DCA Hb A1c Waived, Microalbumin / creatinine urine ratio, CMP14+EGFR, Lipid Panel  Hypertension associated with diabetes (HCC)  Morbid obesity (HCC)  Screening for HIV (human immunodeficiency virus) - Plan: HIV Antibody (routine testing w rflx)  Need for hepatitis C screening test - Plan: Hepatitis C Antibody  She will get [place] results of colon cancer screening and mammogram.  DM well controlled. Resume ozempic at 0.25mg  weekly x1 week, then increase to 0.5mg  weekly thereafter. Sample provided. Call if needs more before the new year.  BP well controlled upon recheck. No changes  Hopefully, will see positive impact on BMI once she resumes lifestyle mod and increases ozempic dosing.  Screen HIV/ Hepc no known exposures.  Counseled on healthy lifestyle choices, including diet (rich in fruits, vegetables and lean meats and low in salt and simple carbohydrates) and exercise (at least 30 minutes of moderate physical activity daily).  Patient to follow up 85m for DM  Kenly Xiao M. Nadine Counts, DO

## 2023-03-11 LAB — HEPATITIS C ANTIBODY: Hep C Virus Ab: NONREACTIVE

## 2023-03-11 LAB — LIPID PANEL
Chol/HDL Ratio: 2.3 {ratio} (ref 0.0–4.4)
Cholesterol, Total: 200 mg/dL — ABNORMAL HIGH (ref 100–199)
HDL: 87 mg/dL (ref >39)
LDL Chol Calc (NIH): 102 mg/dL — ABNORMAL HIGH (ref 0–99)
Triglycerides: 60 mg/dL (ref 0–149)
VLDL Cholesterol Cal: 11 mg/dL (ref 5–40)

## 2023-03-11 LAB — CMP14+EGFR
ALT: 29 [IU]/L (ref 0–32)
AST: 28 [IU]/L (ref 0–40)
Albumin: 4.2 g/dL (ref 3.8–4.9)
Alkaline Phosphatase: 73 [IU]/L (ref 44–121)
BUN/Creatinine Ratio: 11 (ref 9–23)
BUN: 9 mg/dL (ref 6–24)
Bilirubin Total: 0.5 mg/dL (ref 0.0–1.2)
CO2: 26 mmol/L (ref 20–29)
Calcium: 9.7 mg/dL (ref 8.7–10.2)
Chloride: 102 mmol/L (ref 96–106)
Creatinine, Ser: 0.84 mg/dL (ref 0.57–1.00)
Globulin, Total: 2.9 g/dL (ref 1.5–4.5)
Glucose: 58 mg/dL — ABNORMAL LOW (ref 70–99)
Potassium: 3.6 mmol/L (ref 3.5–5.2)
Sodium: 142 mmol/L (ref 134–144)
Total Protein: 7.1 g/dL (ref 6.0–8.5)
eGFR: 83 mL/min/{1.73_m2} (ref >59)

## 2023-03-11 LAB — MICROALBUMIN / CREATININE URINE RATIO
Creatinine, Urine: 123.8 mg/dL
Microalb/Creat Ratio: 4 mg/g{creat} (ref 0–29)
Microalbumin, Urine: 4.6 ug/mL

## 2023-03-11 LAB — HIV ANTIBODY (ROUTINE TESTING W REFLEX): HIV Screen 4th Generation wRfx: NONREACTIVE

## 2023-03-12 ENCOUNTER — Other Ambulatory Visit: Payer: Self-pay | Admitting: Family Medicine

## 2023-03-12 DIAGNOSIS — E1169 Type 2 diabetes mellitus with other specified complication: Secondary | ICD-10-CM

## 2023-03-12 MED ORDER — ROSUVASTATIN CALCIUM 5 MG PO TABS
5.0000 mg | ORAL_TABLET | Freq: Every day | ORAL | 3 refills | Status: AC
Start: 2023-03-12 — End: ?

## 2023-04-03 ENCOUNTER — Encounter: Payer: Self-pay | Admitting: Family Medicine

## 2023-04-07 ENCOUNTER — Ambulatory Visit: Payer: Self-pay | Admitting: *Deleted

## 2023-04-07 NOTE — Patient Outreach (Signed)
  Care Coordination   04/07/2023  Name: Archita Lomeli MRN: [id number] DOB: 03/13/1971   Care Coordination Outreach Attempts:  An unsuccessful telephone outreach was attempted today to offer the patient information about available care coordination services.HIPAA compliant messages left on voicemail, providing contact information for CSW, encouraging patient to return CSW's call at her earliest convenience.  Follow Up Plan:  Additional outreach attempts will be made to offer the patient care coordination information and services.   Encounter Outcome:  No Answer.   Care Coordination Interventions:  No, not indicated.    [name], BSW, MSW, Printmaker Social Work Case Set designer Health  [facility], Population Health Direct Dial: [phone]  Fax: [phone] Email: [email or web address]

## 2023-04-14 ENCOUNTER — Telehealth: Payer: Self-pay | Admitting: *Deleted

## 2023-04-14 NOTE — Patient Outreach (Signed)
  Care Coordination   04/14/2023  Name: Lisa Boyer MRN: [id number] DOB: Jan 28, 1971   Care Coordination Outreach Attempts:  A second unsuccessful outreach was attempted today to offer the patient with information about available care coordination services. HIPAA compliant messages left on voicemail, providing contact information for CSW, encouraging patient to return CSW's call at her earliest convenience.  Follow Up Plan:  Additional outreach attempts will be made to offer the patient care coordination information and services.   Encounter Outcome:  No Answer.   Care Coordination Interventions:  No, not indicated.    [name], BSW, MSW, Printmaker Social Work Case Set designer Health  [facility], Population Health Direct Dial: [phone]  Fax: [phone] Email: [email or web address]: [email or web address]

## 2023-04-16 ENCOUNTER — Telehealth: Payer: Self-pay | Admitting: *Deleted

## 2023-04-16 NOTE — Progress Notes (Signed)
  Care Coordination Note  04/16/2023 Name: Lisa Boyer MRN: [id number] DOB: 1970/11/25  [name] is a 52 y.o. year old female who is a primary care patient of [name], DO and is actively engaged with the care management team. I reached out to [name] by phone today to assist with re-scheduling a follow up visit with the Licensed Clinical Social Worker  Follow up plan: Unsuccessful telephone outreach attempt made. A HIPAA compliant phone message was left for the patient providing contact information and requesting a return call.   [facility]  Care Coordination Care Guide  Direct Dial: [phone]

## 2023-04-17 ENCOUNTER — Telehealth: Payer: Self-pay | Admitting: *Deleted

## 2023-04-17 NOTE — Patient Outreach (Signed)
  Care Coordination   04/17/2023  Name: Lisa Boyer MRN: [id number] DOB: [birth year]   Care Coordination Outreach Attempts:  A third unsuccessful outreach was attempted today to offer the patient with information about available care coordination services. HIPAA compliant messages left on voicemail, providing contact information for CSW, encouraging patient to return CSW's call at her earliest convenience.  Follow Up Plan:  No further outreach attempts will be made at this time. We have been unable to contact the patient to offer or enroll patient in care coordination services.  Encounter Outcome:  No Answer.   Care Coordination Interventions:  No, not indicated.    [name], BSW, MSW, Printmaker Social Work Case Set designer Health  [facility], Population Health Direct Dial: [phone]  Fax: [phone] Email: [email or web address]: [email or web address]

## 2023-04-23 NOTE — Progress Notes (Signed)
  Care Coordination Note  04/23/2023 Name: Lisa Boyer MRN: [id number] DOB: [birth year]  Lisa Boyer is a 52 y.o. year old female who is a primary care patient of [name], DO and is actively engaged with the care management team. I reached out to [name] by phone today to assist with re-scheduling a follow up visit with the Licensed Clinical Social Worker  Follow up plan: Patient declines further follow up and engagement by the care management team. Appropriate care team members and provider have been notified via electronic communication.  [facility]  Care Coordination Care Guide  Direct Dial: [phone]

## 2023-04-23 NOTE — Progress Notes (Signed)
  Care Coordination Note  04/23/2023 Name: Lisa Boyer MRN: [id number] DOB: 1970-06-09  Lisa Boyer is a 52 y.o. year old female who is a primary care patient of [name], DO and is actively engaged with the care management team. I reached out to [name] by phone today to assist with re-scheduling a follow up visit with the Licensed Clinical Social Worker  Follow up plan: Unsuccessful telephone outreach attempt made. A HIPAA compliant phone message was left for the patient providing contact information and requesting a return call.  We have been unable to make contact with the patient for follow up. The care management team is available to follow up with the patient after provider conversation with the patient regarding recommendation for care management engagement and subsequent re-referral to the care management team.   [facility] Coordination Care Guide  Direct Dial: [phone]

## 2023-06-16 ENCOUNTER — Ambulatory Visit (INDEPENDENT_AMBULATORY_CARE_PROVIDER_SITE_OTHER): Payer: Medicare HMO | Admitting: Family Medicine

## 2023-06-16 ENCOUNTER — Encounter: Payer: Self-pay | Admitting: Family Medicine

## 2023-06-16 VITALS — BP 107/74 | HR 76 | Temp 97.9°F | Ht 69.0 in | Wt 235.0 lb

## 2023-06-16 DIAGNOSIS — E785 Hyperlipidemia, unspecified: Secondary | ICD-10-CM

## 2023-06-16 DIAGNOSIS — E1169 Type 2 diabetes mellitus with other specified complication: Secondary | ICD-10-CM

## 2023-06-16 DIAGNOSIS — E1159 Type 2 diabetes mellitus with other circulatory complications: Secondary | ICD-10-CM | POA: Diagnosis not present

## 2023-06-16 DIAGNOSIS — Z7985 Long-term (current) use of injectable non-insulin antidiabetic drugs: Secondary | ICD-10-CM | POA: Diagnosis not present

## 2023-06-16 DIAGNOSIS — I152 Hypertension secondary to endocrine disorders: Secondary | ICD-10-CM

## 2023-06-16 DIAGNOSIS — E119 Type 2 diabetes mellitus without complications: Secondary | ICD-10-CM | POA: Diagnosis not present

## 2023-06-16 LAB — BAYER DCA HB A1C WAIVED: HB A1C (BAYER DCA - WAIVED): 5.6 % (ref 4.8–5.6)

## 2023-06-16 MED ORDER — TRIAMTERENE-HCTZ 75-50 MG PO TABS: 0.5000 | ORAL_TABLET | Freq: Every day | ORAL | 3 refills | Status: AC

## 2023-06-16 MED ORDER — LANCETS MISC. MISC
3 refills | Status: AC
Start: 2023-06-16 — End: ?

## 2023-06-16 MED ORDER — BLOOD GLUCOSE MONITORING SUPPL DEVI
0 refills | Status: AC
Start: 2023-06-16 — End: ?

## 2023-06-16 MED ORDER — OZEMPIC (0.25 OR 0.5 MG/DOSE) 2 MG/3ML ~~LOC~~ SOPN: 0.5000 mg | PEN_INJECTOR | SUBCUTANEOUS | 3 refills | Status: DC

## 2023-06-16 MED ORDER — LANCET DEVICE MISC
0 refills | Status: AC
Start: 2023-06-16 — End: ?

## 2023-06-16 MED ORDER — BLOOD GLUCOSE TEST VI STRP
ORAL_STRIP | 3 refills | Status: AC
Start: 2023-06-16 — End: ?

## 2023-06-16 NOTE — Progress Notes (Signed)
 Subjective: CC:DM PCP: Jolinda Norene HERO, DO Lisa Boyer is a 53 y.o. female presenting to clinic today for:  1. Type 2 Diabetes with hypertension, hyperlipidemia:  She reports compliance with medications but has been out of the Ozempic  for about 3 weeks now.  She was not sure if she was supposed to continue it or needed to get a refill on it since the Medicare year restarted.  She reports that she is going back to the gym today and has been working with a nutritionist.  She is going to start working out with a new friend that she met recently.  Diabetes Health Maintenance Due  Topic Date Due   FOOT EXAM  09/08/2023   HEMOGLOBIN A1C  09/08/2023   OPHTHALMOLOGY EXAM  02/17/2024    Last A1c:  Lab Results  Component Value Date   HGBA1C 5.7 (H) 03/10/2023    ROS: denies dizziness, LOC, polyuria, polydipsia, unintended weight loss/gain, foot ulcerations, numbness or tingling in extremities, shortness of breath or chest pain.   ROS: Per HPI  Allergies  Allergen Reactions   Latex Other (See Comments)    Discolor-stay for while Adhesive tape - leaves a mark on skin   Past Medical History:  Diagnosis Date   Allergy    Anemia    Anxiety    Back pain 04/1992   chronic back pain-diagnose from [place]   Depression    Headache    frequent headache   Hypertension    PTSD (post-traumatic stress disorder)    Thyroid  disease    Uterine fibroid     Current Outpatient Medications:    Biotin w/ Vitamins C & E (HAIR/SKIN/NAILS PO), Take by mouth., Disp: , Rfl:    cetirizine (ZYRTEC) 10 MG tablet, Take 10 mg by mouth at bedtime., Disp: , Rfl:    Cholecalciferol (VITAMIN D) 2000 UNITS tablet, Take 4,000 Units by mouth at bedtime. , Disp: , Rfl:    ciclopirox  (PENLAC ) 8 % solution, Apply topically at bedtime. Apply over nail and surrounding skin. Apply daily over previous coat. After seven (7) days, may remove with alcohol and continue cycle., Disp: 6.6 mL, Rfl: 0   clotrimazole   (LOTRIMIN ) 1 % cream, Apply 1 Application topically 2 (two) times daily., Disp: 30 g, Rfl: 0   Efinaconazole  10 % SOLN, Apply 1 drop topically daily., Disp: 4 mL, Rfl: 11   FLUoxetine  (PROZAC ) 20 MG capsule, Take 1 capsule (20 mg total) by mouth daily. (NEEDS TO BE SEEN BEFORE NEXT REFILL), Disp: 90 capsule, Rfl: 0   Insulin Pen Needle (NOVOFINE PLUS PEN NEEDLE) 32G X 4 MM MISC, UAD to inject Saxenda  daily, Disp: 100 each, Rfl: 3   ketoconazole (NIZORAL) 2 % shampoo, Apply 1 application topically as needed for irritation., Disp: , Rfl:    metoprolol  succinate (TOPROL -XL) 25 MG 24 hr tablet, Take 1 tablet (25 mg total) by mouth daily. (Patient taking differently: Take 12.5 mg by mouth daily.), Disp: 90 tablet, Rfl: 3   naproxen  sodium (ALEVE ) 220 MG tablet, Take 220 mg by mouth daily as needed (Back pain)., Disp: , Rfl:    ondansetron  (ZOFRAN -ODT) 4 MG disintegrating tablet, Take 1 tablet (4 mg total) by mouth every 8 (eight) hours as needed for nausea or vomiting., Disp: 20 tablet, Rfl: 0   OZEMPIC , 0.25 OR 0.5 MG/DOSE, 2 MG/3ML SOPN, Inject 0.5 mg into the skin every 7 (seven) days., Disp: 9 mL, Rfl: 3   polyethylene glycol powder (GLYCOLAX /MIRALAX ) 17 GM/SCOOP powder, Mix  1 capful in 8 ounces of water and drink up to 2 times daily as needed for constipation., Disp: 255 g, Rfl: 1   rosuvastatin  (CRESTOR ) 5 MG tablet, Take 1 tablet (5 mg total) by mouth daily. For cholesterol, Disp: 90 tablet, Rfl: 3   tretinoin (RETIN-A) 0.05 % cream, Apply 1 application topically 3 (three) times a week. , Disp: , Rfl:    triamterene -hydrochlorothiazide (MAXZIDE) 75-50 MG tablet, Take 1 tablet by mouth daily., Disp: 90 tablet, Rfl: 1 Social History   Socioeconomic History   Marital status: Widowed    Spouse name: Not on file   Number of children: 1   Years of education: 84   Highest education level: 12th grade  Occupational History   Occupation: child psychotherapist  Tobacco Use   Smoking status: Never    Passive  exposure: Never   Smokeless tobacco: Never  Vaping Use   Vaping status: Never Used  Substance and Sexual Activity   Alcohol use: Not Currently    Comment: social-twice a year   Drug use: No   Sexual activity: Not Currently    Partners: Male    Birth control/protection: Surgical  Other Topics Concern   Not on file  Social History Narrative   Son lives nearby and visits daily   Social Drivers of Health   Financial Resource Strain: Low Risk  (02/04/2023)   Overall Financial Resource Strain (CARDIA)    Difficulty of Paying Living Expenses: Not hard at all  Food Insecurity: No Food Insecurity (02/04/2023)   Hunger Vital Sign    Worried About Running Out of Food in the Last Year: Never true    Ran Out of Food in the Last Year: Never true  Transportation Needs: No Transportation Needs (02/04/2023)   PRAPARE - Administrator, Civil Service (Medical): No    Lack of Transportation (Non-Medical): No  Physical Activity: Sufficiently Active (02/04/2023)   Exercise Vital Sign    Days of Exercise per Week: 5 days    Minutes of Exercise per Session: 50 min  Recent Concern: Physical Activity - Insufficiently Active (01/06/2023)   Exercise Vital Sign    Days of Exercise per Week: 3 days    Minutes of Exercise per Session: 30 min  Stress: No Stress Concern Present (02/04/2023)   [employer] of Occupational Health - Occupational Stress Questionnaire    Feeling of Stress : Only a little  Social Connections: Moderately Integrated (02/04/2023)   Social Connection and Isolation Panel [NHANES]    Frequency of Communication with Friends and Family: More than three times a week    Frequency of Social Gatherings with Friends and Family: More than three times a week    Attends Religious Services: More than 4 times per year    Active Member of [employer] or Organizations: Yes    Attends Banker Meetings: More than 4 times per year    Marital Status: Widowed  Intimate Partner Violence:  Not At Risk (02/04/2023)   Humiliation, Afraid, Rape, and Kick questionnaire    Fear of Current or Ex-Partner: No    Emotionally Abused: No    Physically Abused: No    Sexually Abused: No   Family History  Adopted: Yes  Problem Relation Age of Onset   Breast cancer Mother        and 2 other types but don't know what kind   Skin cancer Mother     Objective: Office vital signs reviewed. BP 107/74  Pulse 76   Temp 97.9 F (36.6 C)   Ht 5' 9 (1.753 m)   Wt 235 lb (106.6 kg)   LMP 06/22/2016   SpO2 98%   BMI 34.70 kg/m   Physical Examination:  General: Awake, alert, well nourished, No acute distress HEENT: sclera white, MMM Cardio: regular rate and rhythm, S1S2 heard, no murmurs appreciated Pulm: clear to auscultation bilaterally, no wheezes, rhonchi or rales; normal work of breathing on room air  Assessment/ Plan: 53 y.o. female   Diabetes mellitus treated with injections of non-insulin medication (HCC) - Plan: Bayer DCA Hb A1c Waived, Blood Glucose Monitoring Suppl DEVI, Glucose Blood (BLOOD GLUCOSE TEST STRIPS) STRP, Lancet Device MISC, Lancets Misc. MISC, OZEMPIC , 0.25 OR 0.5 MG/DOSE, 2 MG/3ML SOPN  Hypertension associated with diabetes (HCC) - Plan: triamterene -hydrochlorothiazide (MAXZIDE) 75-50 MG tablet  Hyperlipidemia associated with type 2 diabetes mellitus (HCC) - Plan: LDL Cholesterol, Direct, Hepatic function panel   Sugar under excellent control.  No changes needed.  She will continue current regimen with Ozempic .  I gave her a sample as she has had a 3-week lapse and I prefer her to start at 0.25 mg for a couple of weeks and then advance to 0.5 mg.  New Rx sent to pharmacy.  Glucose testing supplies sent to pharmacy.  Blood pressure under good control and in fact a little on the low side side like her to reduce her Maxide to half tablet daily.  She will continue her cholesterol medication as prescribed.  Direct LDL and LFTs were collected today.  Plan to see  her back in 6 months for annual physical with fasting labs.   Norene CHRISTELLA Fielding, DO [facility] [phone]

## 2023-06-17 LAB — HEPATIC FUNCTION PANEL
ALT: 31 [IU]/L (ref 0–32)
AST: 23 [IU]/L (ref 0–40)
Albumin: 4.2 g/dL (ref 3.8–4.9)
Alkaline Phosphatase: 78 [IU]/L (ref 44–121)
Bilirubin Total: 0.4 mg/dL (ref 0.0–1.2)
Bilirubin, Direct: 0.14 mg/dL (ref 0.00–0.40)
Total Protein: 7 g/dL (ref 6.0–8.5)

## 2023-06-17 LAB — LDL CHOLESTEROL, DIRECT: LDL Direct: 89 mg/dL (ref 0–99)

## 2023-07-01 ENCOUNTER — Telehealth: Payer: Self-pay

## 2023-07-01 NOTE — Telephone Encounter (Signed)
Copied from CRM [id number]. Topic: Clinical - Prescription Issue >> Jul 01, 2023  3:49 PM Geroge Baseman wrote: Reason for CRM: OZEMPIC, 0.25 OR 0.5 MG/DOSE, 2 MG/3ML is going to cost the patient too much to pick up. The pharmacy advised her to call the office and see if Dr.Gottschalk can send over a prior authorization.

## 2023-07-02 ENCOUNTER — Telehealth: Payer: Self-pay

## 2023-07-02 ENCOUNTER — Other Ambulatory Visit (HOSPITAL_COMMUNITY): Payer: Self-pay

## 2023-07-02 ENCOUNTER — Telehealth: Payer: Self-pay | Admitting: Family Medicine

## 2023-07-02 NOTE — Telephone Encounter (Signed)
Pharmacy Patient Advocate Encounter   Received notification from Pt Calls Messages that prior authorization for Ozempic (0.25 or 0.5 MG/DOSE) 2MG /3ML pen-injectors is required/requested.   Insurance verification completed.   The patient is insured through [place] .   Per test claim: PA required and submitted KEY/EOC/Request #: [id number] CANCELLED due to:  Authorization already on file for this request. Authorization starting on 06/16/2023 and ending on 06/01/2024  Per test claim: The current 84 day co-pay is, $391.  No PA needed at this time. This test claim was processed through [facility]- copay amounts may vary at other pharmacies due to pharmacy/plan contracts, or as the patient moves through the different stages of their insurance plan.

## 2023-07-02 NOTE — Telephone Encounter (Signed)
Copied from CRM [id number]. Topic: Clinical - Prescription Issue >> Jul 02, 2023  3:08 PM Gaetano Hawthorne wrote: Reason for CRM: Patient would like to know if there's any other alternative/generic or options that her insurance may cover - the patient called earlier this week regarding her OZEMPIC, 0.25 OR 0.5 MG/DOSE, 2 MG/3ML SOPN and it seems that her insurance company does not want to cover that medication anymore. Please call the patient when you have a moment.

## 2023-07-02 NOTE — Telephone Encounter (Signed)
Patient informed. LS

## 2023-07-02 NOTE — Telephone Encounter (Signed)
PA request has been Cancelled. New Encounter created for follow up. For additional info see Pharmacy Prior Auth telephone encounter from 07/02/23.

## 2023-07-03 NOTE — Telephone Encounter (Signed)
Again, no alternatives.  We can use other meds for the diabetes but they won't aid in weight loss like the ozempic. Let me know what she wants to do.

## 2023-07-03 NOTE — Telephone Encounter (Signed)
Left detailed message letting pt know. Awaiting call back. LS

## 2023-07-03 NOTE — Telephone Encounter (Signed)
There are no generic alternatives.  Clinical, can you see if perhaps they are requiring a PA or if she has a deductible to meet?

## 2023-07-03 NOTE — Telephone Encounter (Signed)
PA was already approved. It is the patient deductible that is the issue.

## 2023-07-06 NOTE — Telephone Encounter (Signed)
Left detailed message again. Unable to contact. LS

## 2023-10-13 ENCOUNTER — Encounter: Payer: Self-pay | Admitting: Family Medicine

## 2023-12-25 ENCOUNTER — Telehealth: Payer: Self-pay | Admitting: Family Medicine

## 2023-12-25 NOTE — Telephone Encounter (Signed)
 Copied from CRM [id number]. Topic: Clinical - Medical Advice >> Dec 25, 2023  2:55 PM Carlatta H wrote: Reason for CRM: The patient is taking ozempic  but she would like to know if she can switch to zepbound

## 2023-12-25 NOTE — Telephone Encounter (Signed)
 Called and made patient an appointment,

## 2023-12-30 ENCOUNTER — Ambulatory Visit (INDEPENDENT_AMBULATORY_CARE_PROVIDER_SITE_OTHER): Admitting: Family Medicine

## 2023-12-30 ENCOUNTER — Encounter: Payer: Self-pay | Admitting: Family Medicine

## 2023-12-30 VITALS — BP 122/81 | HR 75 | Temp 97.9°F | Ht 69.0 in | Wt 251.0 lb

## 2023-12-30 DIAGNOSIS — E1159 Type 2 diabetes mellitus with other circulatory complications: Secondary | ICD-10-CM

## 2023-12-30 DIAGNOSIS — E785 Hyperlipidemia, unspecified: Secondary | ICD-10-CM | POA: Diagnosis not present

## 2023-12-30 DIAGNOSIS — E1169 Type 2 diabetes mellitus with other specified complication: Secondary | ICD-10-CM | POA: Diagnosis not present

## 2023-12-30 DIAGNOSIS — E119 Type 2 diabetes mellitus without complications: Secondary | ICD-10-CM

## 2023-12-30 DIAGNOSIS — I152 Hypertension secondary to endocrine disorders: Secondary | ICD-10-CM | POA: Diagnosis not present

## 2023-12-30 DIAGNOSIS — Z7985 Long-term (current) use of injectable non-insulin antidiabetic drugs: Secondary | ICD-10-CM

## 2023-12-30 LAB — BAYER DCA HB A1C WAIVED: HB A1C (BAYER DCA - WAIVED): 5.8 % — ABNORMAL HIGH (ref 4.8–5.6)

## 2023-12-30 MED ORDER — TIRZEPATIDE 2.5 MG/0.5ML ~~LOC~~ SOAJ: 2.5000 mg | SUBCUTANEOUS | 0 refills | Status: AC

## 2023-12-30 NOTE — Progress Notes (Signed)
 Subjective: CC:DM PCP: Jolinda Norene HERO, DO Lisa Boyer is a 53 y.o. female presenting to clinic today for:  1. Type 2 Diabetes with hypertension, hyperlipidemia:  She came off the Ozempic  due to cost reasons.  She was not able to get it through community pharmacy without it being very expensive.  She did briefly mention this to [facility] but she thinks maybe they do not realize that she is a diabetic and thought that she only wanted it for weight loss.  She has been exercising regularly over the last month and found the partner to workout with at [employer] and eating.  She has been doing mostly weightlifting but is trying to increase her cardio.  She [facility] her next [facility] with [facility] on Monday  Diabetes Health Maintenance Due  Topic Date Due   FOOT EXAM  09/08/2023   HEMOGLOBIN A1C  12/14/2023   OPHTHALMOLOGY EXAM  02/17/2024    Last A1c:  Lab Results  Component Value Date   HGBA1C 5.6 06/16/2023    ROS: deines dizziness, LOC, polyuria, polydipsia, unintended weight loss/gain, foot ulcerations, numbness or tingling in extremities, shortness of breath or chest pain.   ROS: Per HPI  Allergies  Allergen Reactions   Latex Other (See Comments)    Discolor-stay for while Adhesive tape - leaves a mark on skin   Past Medical History:  Diagnosis Date   Allergy    Anemia    Anxiety    Back pain 04/1992   chronic back pain-diagnose from [place]   Depression    Headache    frequent headache   Hypertension    PTSD (post-traumatic stress disorder)    Thyroid  disease    Uterine fibroid     Current Outpatient Medications:    Biotin w/ Vitamins C & E (HAIR/SKIN/NAILS PO), Take by mouth., Disp: , Rfl:    Blood Glucose Monitoring Suppl DEVI, Check BGs daily E11.9. May substitute to any manufacturer covered by patient's insurance., Disp: 1 each, Rfl: 0   cetirizine (ZYRTEC) 10 MG tablet, Take 10 mg by mouth at bedtime., Disp: , Rfl:    Cholecalciferol (VITAMIN D) 2000  UNITS tablet, Take 4,000 Units by mouth at bedtime. , Disp: , Rfl:    ciclopirox  (PENLAC ) 8 % solution, Apply topically at bedtime. Apply over nail and surrounding skin. Apply daily over previous coat. After seven (7) days, may remove with alcohol and continue cycle., Disp: 6.6 mL, Rfl: 0   clotrimazole  (LOTRIMIN ) 1 % cream, Apply 1 Application topically 2 (two) times daily., Disp: 30 g, Rfl: 0   Efinaconazole  10 % SOLN, Apply 1 drop topically daily., Disp: 4 mL, Rfl: 11   FLUoxetine  (PROZAC ) 20 MG capsule, Take 1 capsule (20 mg total) by mouth daily. (NEEDS TO BE SEEN BEFORE NEXT REFILL), Disp: 90 capsule, Rfl: 0   Glucose Blood (BLOOD GLUCOSE TEST STRIPS) STRP, Check BGs daily E11.9.May substitute to any manufacturer covered by patient's insurance., Disp: 100 strip, Rfl: 3   Insulin Pen Needle (NOVOFINE PLUS PEN NEEDLE) 32G X 4 MM MISC, UAD to inject Saxenda  daily, Disp: 100 each, Rfl: 3   ketoconazole (NIZORAL) 2 % shampoo, Apply 1 application topically as needed for irritation., Disp: , Rfl:    Lancet Device MISC, Check BGs daily E11.9.May substitute to any manufacturer covered by patient's insurance., Disp: 1 each, Rfl: 0   Lancets Misc. MISC, Check BGs daily E11.9.May substitute to any manufacturer covered by patient's insurance., Disp: 100 each, Rfl: 3  metoprolol  succinate (TOPROL -XL) 25 MG 24 hr tablet, Take 1 tablet (25 mg total) by mouth daily. (Patient taking differently: Take 12.5 mg by mouth daily.), Disp: 90 tablet, Rfl: 3   naproxen  sodium (ALEVE ) 220 MG tablet, Take 220 mg by mouth daily as needed (Back pain)., Disp: , Rfl:    ondansetron  (ZOFRAN -ODT) 4 MG disintegrating tablet, Take 1 tablet (4 mg total) by mouth every 8 (eight) hours as needed for nausea or vomiting., Disp: 20 tablet, Rfl: 0   OZEMPIC , 0.25 OR 0.5 MG/DOSE, 2 MG/3ML SOPN, Inject 0.5 mg into the skin every 7 (seven) days., Disp: 9 mL, Rfl: 3   polyethylene glycol powder (GLYCOLAX /MIRALAX ) 17 GM/SCOOP powder, Mix 1  capful in 8 ounces of water and drink up to 2 times daily as needed for constipation., Disp: 255 g, Rfl: 1   rosuvastatin  (CRESTOR ) 5 MG tablet, Take 1 tablet (5 mg total) by mouth daily. For cholesterol, Disp: 90 tablet, Rfl: 3   tretinoin (RETIN-A) 0.05 % cream, Apply 1 application topically 3 (three) times a week. , Disp: , Rfl:    triamterene -hydrochlorothiazide (MAXZIDE) 75-50 MG tablet, Take 0.5 tablets by mouth daily., Disp: 45 tablet, Rfl: 3 Social History   Socioeconomic History   Marital status: Widowed    Spouse name: Not on file   Number of children: 1   Years of education: 23   Highest education level: 12th grade  Occupational History   Occupation: Child psychotherapist  Tobacco Use   Smoking status: Never    Passive exposure: Never   Smokeless tobacco: Never  Vaping Use   Vaping status: Never Used  Substance and Sexual Activity   Alcohol use: Not Currently    Comment: social-twice a year   Drug use: No   Sexual activity: Not Currently    Partners: Male    Birth control/protection: Surgical  Other Topics Concern   Not on file  Social History Narrative   Son lives nearby and visits daily   Social Drivers of Health   Financial Resource Strain: Low Risk  (02/04/2023)   Overall Financial Resource Strain (CARDIA)    Difficulty of Paying Living Expenses: Not hard at all  Food Insecurity: No Food Insecurity (02/04/2023)   Hunger Vital Sign    Worried About Running Out of Food in the Last Year: Never true    Ran Out of Food in the Last Year: Never true  Transportation Needs: No Transportation Needs (02/04/2023)   PRAPARE - Administrator, Civil Service (Medical): No    Lack of Transportation (Non-Medical): No  Physical Activity: Sufficiently Active (02/04/2023)   Exercise Vital Sign    Days of Exercise per Week: 5 days    Minutes of Exercise per Session: 50 min  Recent Concern: Physical Activity - Insufficiently Active (01/06/2023)   Exercise Vital Sign    Days of  Exercise per Week: 3 days    Minutes of Exercise per Session: 30 min  Stress: No Stress Concern Present (02/04/2023)   [employer] of Occupational Health - Occupational Stress Questionnaire    Feeling of Stress : Only a little  Social Connections: Moderately Integrated (02/04/2023)   Social Connection and Isolation Panel    Frequency of Communication with Friends and Family: More than three times a week    Frequency of Social Gatherings with Friends and Family: More than three times a week    Attends Religious Services: More than 4 times per year    Active Member of  Clubs or Organizations: Yes    Attends Banker Meetings: More than 4 times per year    Marital Status: Widowed  Intimate Partner Violence: Not At Risk (02/04/2023)   Humiliation, Afraid, Rape, and Kick questionnaire    Fear of Current or Ex-Partner: No    Emotionally Abused: No    Physically Abused: No    Sexually Abused: No   Family History  Adopted: Yes  Problem Relation Age of Onset   Breast cancer Mother        and 2 other types but don't know what kind   Skin cancer Mother     Objective: Office vital signs reviewed. BP 122/81   Pulse 75   Temp 97.9 F (36.6 C)   Ht 5' 9 (1.753 m)   Wt 251 lb (113.9 kg)   LMP 06/22/2016   SpO2 98%   BMI 37.07 kg/m   Physical Examination:  General: Awake, alert, obese, No acute distress HEENT: sclera white, MMM Cardio: regular rate and rhythm  Pulm:  normal work of breathing on room air    Assessment/ Plan: 53 y.o. female   Diabetes mellitus treated with injections of non-insulin medication (HCC) - Plan: Bayer DCA Hb A1c Waived, tirzepatide  (MOUNJARO ) 2.5 MG/0.5ML Pen  Hypertension associated with diabetes (HCC)  Hyperlipidemia associated with type 2 diabetes mellitus (HCC)  Sugar technically controlled A1c at 5.8.  On going to try and get her Mounjaro  to help achieve weight loss goals and continue to control sugar.  I did go ahead and write a  letter to [facility] provider explaining that she in fact does have type 2 diabetes and I printed out her diagnostic A1c from January of last year.  Hopefully they can get this medication covered for her at a much lower cost but if not she will contact me and we will look into other treatment plan  She will continue all other medications as prescribed and may follow-up with me in 4 months, sooner if concerns arise  Jalayla Chrismer CHRISTELLA Fielding, DO [facility] [phone]

## 2023-12-31 ENCOUNTER — Telehealth: Payer: Self-pay | Admitting: *Deleted

## 2023-12-31 NOTE — Telephone Encounter (Signed)
 Copied from CRM [id number]. Topic: Clinical - Medical Advice >> Dec 31, 2023  2:07 PM Zane F wrote: Reason for CRM:   Patient is contacting the office to request that the PCP's office fax the documentation (labs from 06/09/2022 confirming that she ha type 2 diabetes and the 12/30/2023 letter Dr. Jolinda wrote for her detailing her need for ozempic  or zepbound  and requesting the [facility] to cover the prescription if not covered by the community pharmacy) The patient is requesting this information be sent to the [facility] via fax and addressed to [facility] LPN.  (The patient has this information in hand to give to the [place] but they are requesting it to also be sent directly from the provider's office. The patient will give the documentation she has, on Monday. )    Fax Number: [phone] >> Dec 31, 2023  2:19 PM Zane F wrote: Patient is looking for a callback once the matter is reviewed.  Callback Number: [phone]

## 2024-01-04 ENCOUNTER — Other Ambulatory Visit: Payer: Self-pay | Admitting: Family Medicine

## 2024-01-04 NOTE — Telephone Encounter (Unsigned)
 Copied from CRM [id number]. Topic: Clinical - Medication Refill >> Jan 04, 2024 12:27 PM Geneva B wrote: Medication:  ondansetron  (ZOFRAN -ODT) 4 MG disintegrating tablet Has the patient contacted their pharmacy? Yes (Agent: If no, request that the patient contact the pharmacy for the refill. If patient does not wish to contact the pharmacy document the reason why and proceed with request.) (Agent: If yes, when and what did the pharmacy advise?)  This is the patient's preferred pharmacy:  [facility] - [place], [place] - [address] [place] [address] Phone: [phone] Fax: [phone]   Is this the correct pharmacy for this prescription? Yes If no, delete pharmacy and type the correct one.   Has the prescription been filled recently? Yes  Is the patient out of the medication? Yes  Has the patient been seen for an appointment in the last year OR does the patient have an upcoming appointment? Yes  Can we respond through MyChart? No  Agent: Please be advised that Rx refills may take up to 3 business days. We ask that you follow-up with your pharmacy.

## 2024-01-06 ENCOUNTER — Other Ambulatory Visit: Payer: Self-pay | Admitting: Family Medicine

## 2024-01-06 MED ORDER — ONDANSETRON 4 MG PO TBDP: 4.0000 mg | ORAL_TABLET | Freq: Three times a day (TID) | ORAL | 0 refills | Status: AC | PRN

## 2024-01-06 NOTE — Telephone Encounter (Signed)
 Copied from CRM #[id number]. Topic: Clinical - Medication Refill >> Jan 06, 2024 11:20 AM Carlatta H wrote: Medication: ondansetron  (ZOFRAN -ODT) 4 MG disintegrating tablet [[id number]  Has the patient contacted their pharmacy? No (Agent: If no, request that the patient contact the pharmacy for the refill. If patient does not wish to contact the pharmacy document the reason why and proceed with request.) (Agent: If yes, when and what did the pharmacy advise?)  This is the patient's preferred pharmacy:  [facility] - [place], [place] - [address] [place] [address] Phone: [phone] Fax: [phone]   Is this the correct pharmacy for this prescription? Yes If no, delete pharmacy and type the correct one.   Has the prescription been filled recently? No  Is the patient out of the medication? Yes  Has the patient been seen for an appointment in the last year OR does the patient have an upcoming appointment? Yes  Can we respond through MyChart? No  Agent: Please be advised that Rx refills may take up to 3 business days. We ask that you follow-up with your pharmacy.

## 2024-01-07 ENCOUNTER — Ambulatory Visit (INDEPENDENT_AMBULATORY_CARE_PROVIDER_SITE_OTHER): Payer: No Typology Code available for payment source

## 2024-01-07 VITALS — BP 122/81 | HR 75 | Ht 69.0 in | Wt 251.0 lb

## 2024-01-07 DIAGNOSIS — Z0000 Encounter for general adult medical examination without abnormal findings: Secondary | ICD-10-CM | POA: Diagnosis not present

## 2024-01-07 NOTE — Patient Instructions (Addendum)
 Ms. Hauth , Thank you for taking time out of your busy schedule to complete your Annual Wellness Visit with me. I enjoyed our conversation and look forward to speaking with you again next year. I, as well as your care team,  appreciate your ongoing commitment to your health goals. Please review the following plan we discussed and let me know if I can assist you in the future. Your Game plan/ To Do List    Referrals: If you haven't heard from the office you've been referred to, please reach out to them at the phone provided.   Follow up Visits: We will see or speak with you next year for your Next Medicare AWV with our clinical staff on 01/09/25 at 2:30p.m. Have you seen your provider in the last 6 months (3 months if uncontrolled diabetes)? Yes  Clinician Recommendations:  Aim for 30 minutes of exercise or brisk walking, 6-8 glasses of water, and 5 servings of fruits and vegetables each day.       This is a list of the screenings recommended for you:  Health Maintenance  Topic Date Due   Pneumococcal Vaccine for age over 71 (1 of 2 - PCV) Never done   Hepatitis B Vaccine (1 of 3 - 19+ 3-dose series) Never done   COVID-19 Vaccine (4 - 2024-25 season) 02/01/2023   Complete foot exam   09/08/2023   Medicare Annual Wellness Visit  01/06/2024   Flu Shot  01/01/2024   DTaP/Tdap/Td vaccine (1 - Tdap) 06/15/2024*   Pneumococcal Vaccine for high risk medical condition (1 of 2 - PCV) 06/15/2024*   Mammogram  06/15/2024*   Colon Cancer Screening  06/15/2024*   Eye exam for diabetics  02/17/2024   Yearly kidney function blood test for diabetes  03/09/2024   Yearly kidney health urinalysis for diabetes  03/09/2024   Hemoglobin A1C  07/01/2024   Hepatitis C Screening  Completed   HIV Screening  Completed   Zoster (Shingles) Vaccine  Completed   HPV Vaccine  Aged Out   Meningitis B Vaccine  Aged Out  *Topic was postponed. The date shown is not the original due date.    Advanced directives:  (Declined) Advance directive discussed with you today. Even though you declined this today, please call our office should you change your mind, and we can give you the proper paperwork for you to fill out. Advance Care Planning is important because it:  [x]  Makes sure you receive the medical care that is consistent with your values, goals, and preferences  [x]  It provides guidance to your family and loved ones and reduces their decisional burden about whether or not they are making the right decisions based on your wishes.  Follow the link provided in your after visit summary or read over the paperwork we have mailed to you to help you started getting your Advance Directives in place. If you need assistance in completing these, please reach out to us  so that we can help you!  See attachments for Preventive Care and Fall Prevention Tips.

## 2024-01-07 NOTE — Progress Notes (Signed)
 Subjective:   Petula Rotolo is a 53 y.o. who presents for a Medicare Wellness preventive visit.  As a reminder, Annual Wellness Visits don't include a physical exam, and some assessments may be limited, especially if this visit is performed virtually. We may recommend an in-person follow-up visit with your provider if needed.  Visit Complete: Virtual I connected with  Verneita Buerger on 01/07/24 by a audio enabled telemedicine application and verified that I am speaking with the correct person using two identifiers.  Patient Location: Home  Provider Location: Home Office  I discussed the limitations of evaluation and management by telemedicine. The patient expressed understanding and agreed to proceed.  Vital Signs: Because this visit was a virtual/telehealth visit, some criteria may be missing or patient reported. Any vitals not documented were not able to be obtained and vitals that have been documented are patient reported.  VideoDeclined- This patient declined Librarian, academic. Therefore the visit was completed with audio only.  Persons Participating in Visit: Patient.  AWV Questionnaire: No: Patient Medicare AWV questionnaire was not completed prior to this visit.  Cardiac Risk Factors include: advanced age (>43men, >48 women);hypertension;diabetes mellitus;obesity (BMI >30kg/m2)     Objective:    Today's Vitals   01/07/24 1354 01/07/24 1356  BP: 122/81   Pulse: 75   Weight: 251 lb (113.9 kg)   Height: 5' 9 (1.753 m)   PainSc:  5    Body mass index is 37.07 kg/m.     01/07/2024    1:59 PM 02/04/2023    9:15 AM 01/06/2023    1:04 PM 01/05/2023    8:30 AM 04/30/2021    4:28 PM 11/19/2019    6:06 AM 09/06/2014    6:44 AM  Advanced Directives  Does Patient Have a Medical Advance Directive? No Yes No No;Yes No No Yes   Type of Furniture conservator/restorer;Living will     Living will   Does patient want to make changes to  medical advance directive?  No - Patient declined  No - Patient declined   No - Patient declined   Copy of Healthcare Power of Attorney in Chart?  No - copy requested     No - copy requested   Would patient like information on creating a medical advance directive?   Yes (MAU/Ambulatory/Procedural Areas - Information given)  No - Patient declined No - Patient declined      Data saved with a previous flowsheet row definition    Current Medications (verified) Outpatient Encounter Medications as of 01/07/2024  Medication Sig   Biotin w/ Vitamins C & E (HAIR/SKIN/NAILS PO) Take by mouth.   Blood Glucose Monitoring Suppl DEVI Check BGs daily E11.9. May substitute to any manufacturer covered by patient's insurance.   cetirizine (ZYRTEC) 10 MG tablet Take 10 mg by mouth at bedtime.   Cholecalciferol (VITAMIN D) 2000 UNITS tablet Take 4,000 Units by mouth at bedtime.    ciclopirox  (PENLAC ) 8 % solution Apply topically at bedtime. Apply over nail and surrounding skin. Apply daily over previous coat. After seven (7) days, may remove with alcohol and continue cycle.   clotrimazole  (LOTRIMIN ) 1 % cream Apply 1 Application topically 2 (two) times daily.   Efinaconazole  10 % SOLN Apply 1 drop topically daily.   FLUoxetine  (PROZAC ) 20 MG capsule Take 1 capsule (20 mg total) by mouth daily. (NEEDS TO BE SEEN BEFORE NEXT REFILL)   Glucose Blood (BLOOD GLUCOSE TEST STRIPS) STRP Check  BGs daily E11.9.May substitute to any manufacturer covered by patient's insurance.   Insulin Pen Needle (NOVOFINE PLUS PEN NEEDLE) 32G X 4 MM MISC UAD to inject Saxenda  daily   ketoconazole (NIZORAL) 2 % shampoo Apply 1 application topically as needed for irritation.   Lancet Device MISC Check BGs daily E11.9.May substitute to any manufacturer covered by patient's insurance.   Lancets Misc. MISC Check BGs daily E11.9.May substitute to any manufacturer covered by patient's insurance.   metoprolol  succinate (TOPROL -XL) 25 MG 24 hr  tablet Take 1 tablet (25 mg total) by mouth daily. (Patient taking differently: Take 12.5 mg by mouth daily.)   naproxen  sodium (ALEVE ) 220 MG tablet Take 220 mg by mouth daily as needed (Back pain).   ondansetron  (ZOFRAN -ODT) 4 MG disintegrating tablet Take 1 tablet (4 mg total) by mouth every 8 (eight) hours as needed for nausea or vomiting.   polyethylene glycol powder (GLYCOLAX /MIRALAX ) 17 GM/SCOOP powder Mix 1 capful in 8 ounces of water and drink up to 2 times daily as needed for constipation.   rosuvastatin  (CRESTOR ) 5 MG tablet Take 1 tablet (5 mg total) by mouth daily. For cholesterol   tirzepatide  (MOUNJARO ) 2.5 MG/0.5ML Pen Inject 2.5 mg into the skin once a week.   tretinoin (RETIN-A) 0.05 % cream Apply 1 application topically 3 (three) times a week.    triamterene -hydrochlorothiazide (MAXZIDE) 75-50 MG tablet Take 0.5 tablets by mouth daily.   No facility-administered encounter medications on file as of 01/07/2024.    Allergies (verified) Latex   History: Past Medical History:  Diagnosis Date   Allergy    Anemia    Anxiety    Back pain 04/1992   chronic back pain-diagnose from [place]   Depression    Headache    frequent headache   Hypertension    PTSD (post-traumatic stress disorder)    Thyroid  disease    Uterine fibroid    Past Surgical History:  Procedure Laterality Date   ABDOMINAL HYSTERECTOMY  08/13/2017   left ovary removed   BREAST BIOPSY Bilateral 1888/1191/1994/2001   remove growth from both side breast   ENDOMETRIAL ABLATION     THYROIDECTOMY  08/1999   partial   Family History  Adopted: Yes  Problem Relation Age of Onset   Breast cancer Mother        and 2 other types but don't know what kind   Skin cancer Mother    Social History   Socioeconomic History   Marital status: Widowed    Spouse name: Not on file   Number of children: 1   Years of education: 78   Highest education level: 12th grade  Occupational History   Occupation: Child psychotherapist   Tobacco Use   Smoking status: Never    Passive exposure: Never   Smokeless tobacco: Never  Vaping Use   Vaping status: Never Used  Substance and Sexual Activity   Alcohol use: Not Currently    Comment: social-twice a year   Drug use: No   Sexual activity: Not Currently    Partners: Male    Birth control/protection: Surgical  Other Topics Concern   Not on file  Social History Narrative   Son lives nearby and visits daily   Social Drivers of Health   Financial Resource Strain: Low Risk  (01/07/2024)   Overall Financial Resource Strain (CARDIA)    Difficulty of Paying Living Expenses: Not hard at all  Food Insecurity: No Food Insecurity (01/07/2024)   Hunger Vital Sign  Worried About Programme researcher, broadcasting/film/video in the Last Year: Never true    Ran Out of Food in the Last Year: Never true  Transportation Needs: No Transportation Needs (01/07/2024)   PRAPARE - Administrator, Civil Service (Medical): No    Lack of Transportation (Non-Medical): No  Physical Activity: Sufficiently Active (01/07/2024)   Exercise Vital Sign    Days of Exercise per Week: 7 days    Minutes of Exercise per Session: 30 min  Stress: No Stress Concern Present (01/07/2024)   [employer] of Occupational Health - Occupational Stress Questionnaire    Feeling of Stress: Not at all  Social Connections: Moderately Isolated (01/07/2024)   Social Connection and Isolation Panel    Frequency of Communication with Friends and Family: More than three times a week    Frequency of Social Gatherings with Friends and Family: More than three times a week    Attends Religious Services: More than 4 times per year    Active Member of [employer] or Organizations: No    Attends Banker Meetings: Never    Marital Status: Widowed    Tobacco Counseling Counseling given: Yes    Clinical Intake:  Pre-visit preparation completed: Yes  Pain : 0-10 (back pain) Pain Score: 5  Pain Type: Chronic pain Pain  Location: Back Pain Orientation: Lower Pain Descriptors / Indicators: Constant Pain Onset: More than a month ago Pain Frequency: Constant Pain Relieving Factors: heat pad/lidocane patches  Pain Relieving Factors: heat pad/lidocane patches  BMI - recorded: 37.07 Nutritional Status: BMI > 30  Obese Nutritional Risks: None Diabetes: Yes CBG done?: No  Lab Results  Component Value Date   HGBA1C 5.8 (H) 12/30/2023   HGBA1C 5.6 06/16/2023   HGBA1C 5.7 (H) 03/10/2023     How often do you need to have someone help you when you read instructions, pamphlets, or other written materials from your doctor or pharmacy?: 1 - Never  Interpreter Needed?: No  Information entered by :: alia t/cma   Activities of Daily Living     01/07/2024    2:11 PM 01/07/2024    1:58 PM  In your present state of health, do you have any difficulty performing the following activities:  Hearing? 1 1  Vision? 0 0  Difficulty concentrating or making decisions? 1 1  Comment concentrating   Walking or climbing stairs? 0 0  Dressing or bathing? 0 0  Doing errands, shopping? 0 0  Preparing Food and eating ? N N  Using the Toilet? N N  In the past six months, have you accidently leaked urine? N N  Do you have problems with loss of bowel control? N N  Managing your Medications? N N  Managing your Finances? N N  Housekeeping or managing your Housekeeping? N N    Patient Care Team: Jolinda Norene HERO, DO as PCP - General (Family Medicine)  I have updated your Care Teams any recent Medical Services you may have received from other providers in the past year.     Assessment:   This is a routine wellness examination for Lawren.  Hearing/Vision screen Hearing Screening - Comments:: Pt have a little bit hearing dif/getting ear flush at [facility] Vision Screening - Comments:: Pt denies vision dif/pt goes [facility] in [place]/last ov 2025   Goals Addressed             This Visit's Progress    Exercise  3x per week (30 min per  time)   On track      Depression Screen     01/07/2024    2:34 PM 06/16/2023    2:11 PM 03/10/2023    2:21 PM 03/10/2023    2:04 PM 02/04/2023    9:10 AM 01/21/2023   11:44 AM 01/06/2023    1:02 PM  PHQ 2/9 Scores  PHQ - 2 Score 1 4 0 0 1 0 0  PHQ- 9 Score 4 20   3  0 0    Fall Risk     01/07/2024    1:56 PM 03/10/2023    2:21 PM 03/10/2023    2:04 PM 02/04/2023    9:01 AM 01/21/2023   11:44 AM  Fall Risk   Falls in the past year? 1 1 0 0 0  Number falls in past yr: 0 0  0 0  Injury with Fall? 0 0  0 0  Risk for fall due to : Impaired balance/gait;Impaired mobility History of fall(s)  No Fall Risks No Fall Risks  Follow up  Falls evaluation completed  Falls evaluation completed;Education provided;Falls prevention discussed Falls prevention discussed    MEDICARE RISK AT HOME:  Medicare Risk at Home Any stairs in or around the home?: No If so, are there any without handrails?: No Home free of loose throw rugs in walkways, pet beds, electrical cords, etc?: Yes Adequate lighting in your home to reduce risk of falls?: Yes Life alert?: No Use of a cane, walker or w/c?: No Grab bars in the bathroom?: Yes Shower chair or bench in shower?: No Elevated toilet seat or a handicapped toilet?: Yes  TIMED UP AND GO:  Was the test performed?  no  Cognitive Function: 6CIT completed        01/07/2024    1:59 PM 01/06/2023    1:04 PM 04/30/2021    4:18 PM  6CIT Screen  What Year? 0 points 0 points 0 points  What month? 0 points 0 points 0 points  What time? 0 points 0 points 0 points  Count back from 20 0 points 0 points 0 points  Months in reverse 0 points 0 points 2 points  Repeat phrase 0 points 0 points 2 points  Total Score 0 points 0 points 4 points    Immunizations Immunization History  Administered Date(s) Administered   Influenza, Seasonal, Injecte, Preservative Fre 02/17/2023   Influenza,inj,Quad PF,6+ Mos 04/15/2021   Influenza-Unspecified 06/10/2020    PFIZER(Purple Top)SARS-COV-2 Vaccination 07/30/2019, 08/20/2019, 12/09/2021   Zoster Recombinant(Shingrix) 04/06/2020, 09/26/2020    Screening Tests Health Maintenance  Topic Date Due   Pneumococcal Vaccine: 50+ Years (1 of 2 - PCV) Never done   Hepatitis B Vaccines (1 of 3 - 19+ 3-dose series) Never done   COVID-19 Vaccine (4 - 2024-25 season) 02/01/2023   FOOT EXAM  09/08/2023   INFLUENZA VACCINE  01/01/2024   DTaP/Tdap/Td (1 - Tdap) 06/15/2024 (Originally 06/29/1989)   Pneumococcal Vaccine: 19-49 Years (1 of 2 - PCV) 06/15/2024 (Originally 06/29/1989)   MAMMOGRAM  06/15/2024 (Originally 05/15/2021)   Colonoscopy  06/15/2024 (Originally 06/29/2014)   OPHTHALMOLOGY EXAM  02/17/2024   Diabetic kidney evaluation - eGFR measurement  03/09/2024   Diabetic kidney evaluation - Urine ACR  03/09/2024   HEMOGLOBIN A1C  07/01/2024   Medicare Annual Wellness (AWV)  01/06/2025   Hepatitis C Screening  Completed   HIV Screening  Completed   Zoster Vaccines- Shingrix  Completed   HPV VACCINES  Aged Out   Meningococcal B  Vaccine  Aged Out    Health Maintenance  Health Maintenance Due  Topic Date Due   Pneumococcal Vaccine: 50+ Years (1 of 2 - PCV) Never done   Hepatitis B Vaccines (1 of 3 - 19+ 3-dose series) Never done   COVID-19 Vaccine (4 - 2024-25 season) 02/01/2023   FOOT EXAM  09/08/2023   INFLUENZA VACCINE  01/01/2024   Health Maintenance Items Addressed: See Nurse Notes at the end of this note  Additional Screening:  Vision Screening: Recommended annual ophthalmology exams for early detection of glaucoma and other disorders of the eye. Would you like a referral to an eye doctor? No    Dental Screening: Recommended annual dental exams for proper oral hygiene  Community Resource Referral / Chronic Care Management: CRR required this visit?  No   CCM required this visit?  No   Plan:    I have personally reviewed and noted the following in the patient's chart:    Medical and social history Use of alcohol, tobacco or illicit drugs  Current medications and supplements including opioid prescriptions. Patient is not currently taking opioid prescriptions. Functional ability and status Nutritional status Physical activity Advanced directives List of other physicians Hospitalizations, surgeries, and ER visits in previous 12 months Vitals Screenings to include cognitive, depression, and falls Referrals and appointments  In addition, I have reviewed and discussed with patient certain preventive protocols, quality metrics, and best practice recommendations. A written personalized care plan for preventive services as well as general preventive health recommendations were provided to patient.   Ozie Ned, CMA   01/07/2024   After Visit Summary: (MyChart) Due to this being a telephonic visit, the after visit summary with patients personalized plan was offered to patient via MyChart   Notes: Nothing significant to report at this time.

## 2024-01-19 ENCOUNTER — Encounter: Payer: Medicare HMO | Admitting: Family Medicine

## 2024-03-14 DIAGNOSIS — R070 Pain in throat: Secondary | ICD-10-CM | POA: Diagnosis not present

## 2024-03-14 DIAGNOSIS — B349 Viral infection, unspecified: Secondary | ICD-10-CM | POA: Diagnosis not present

## 2024-03-14 DIAGNOSIS — R6883 Chills (without fever): Secondary | ICD-10-CM | POA: Diagnosis not present

## 2024-03-14 DIAGNOSIS — J040 Acute laryngitis: Secondary | ICD-10-CM | POA: Diagnosis not present

## 2024-03-14 DIAGNOSIS — Z20822 Contact with and (suspected) exposure to covid-19: Secondary | ICD-10-CM | POA: Diagnosis not present

## 2024-03-14 DIAGNOSIS — R051 Acute cough: Secondary | ICD-10-CM | POA: Diagnosis not present

## 2024-04-11 ENCOUNTER — Telehealth: Payer: Self-pay

## 2024-04-11 NOTE — Telephone Encounter (Signed)
 Copied from CRM [id number]. Topic: General - Call Back - No Documentation >> Apr 11, 2024  1:55 PM Lauren C wrote: Reason for CRM: Returning call from [facility] PARAS, CMA. Please give pt a call back at [phone]

## 2024-04-11 NOTE — Telephone Encounter (Signed)
 Patient stated that she would contact [facility] about getting her ozempic .

## 2024-04-11 NOTE — Telephone Encounter (Signed)
 Left message for patient to call back

## 2024-04-11 NOTE — Telephone Encounter (Signed)
 Copied from CRM [id number]. Topic: Clinical - Medication Question >> Apr 11, 2024  1:38 PM Delon HERO wrote: Reason for CRM: Patient is calling to request a sample for ozempic . Patient paid $47 ozempic  last month. This month $297.00. Patient is unable to pay that. Needing advice

## 2024-05-09 ENCOUNTER — Telehealth: Payer: Self-pay | Admitting: Family Medicine

## 2024-05-09 ENCOUNTER — Encounter: Payer: Self-pay | Admitting: Family Medicine

## 2024-05-09 DIAGNOSIS — E1169 Type 2 diabetes mellitus with other specified complication: Secondary | ICD-10-CM | POA: Insufficient documentation

## 2024-05-09 DIAGNOSIS — E119 Type 2 diabetes mellitus without complications: Secondary | ICD-10-CM | POA: Insufficient documentation

## 2024-05-09 NOTE — Telephone Encounter (Signed)
 Left message for patient to call back to reschedule appointment from 05/09/2024.

## 2024-05-09 NOTE — Telephone Encounter (Signed)
 Copied from CRM [id number]. Topic: Appointments - Scheduling Inquiry for Clinic >> May 09, 2024  9:40 AM Graeme ORN wrote: Reason for CRM: Patient called would like to reschedule appt. Appt was for physical - unable to come due to weather - unable to take 1/28 appt no other appts until August. Can someone reach out to patient if physical appt opens up. Thank You    ----------------------------------------------------------------------- From previous Reason for Contact - Cancel/Reschedule: Patient/patient representative is calling to cancel or reschedule an appointment. Refer to attachments for appointment information.

## 2024-05-09 NOTE — Progress Notes (Deleted)
 Skylee Baird is a 53 y.o. female presents to office today for annual physical exam examination.     Type 2 Diabetes with hypertension, hyperlipidemia:  Glucometer:***.   High at home: ***; Low at home: ***, Taking medication(s): ***,.  Last eye exam: needs Last foot exam: needs Last A1c:  Lab Results  Component Value Date   HGBA1C 5.8 (H) 12/30/2023   Nephropathy screen indicated?: needs Last flu, zoster and/or pneumovax:  Immunization History  Administered Date(s) Administered   Influenza, Seasonal, Injecte, Preservative Fre 02/17/2023   Influenza,inj,Quad PF,6+ Mos 04/15/2021   Influenza-Unspecified 06/10/2020   PFIZER(Purple Top)SARS-COV-2 Vaccination 07/30/2019, 08/20/2019, 12/09/2021   Zoster Recombinant(Shingrix) 04/06/2020, 09/26/2020    ROS: ***dizziness, LOC, polyuria, polydipsia, unintended weight loss/gain, foot ulcerations, numbness or tingling in extremities, shortness of breath or chest pain.    Occupation: ***, Marital status: ***, Substance use: *** Health Maintenance Due  Topic Date Due   Pneumococcal Vaccine: 50+ Years (1 of 2 - PCV) Never done   Hepatitis B Vaccines 19-59 Average Risk (1 of 3 - 19+ 3-dose series) Never done   FOOT EXAM  09/08/2023   Influenza Vaccine  01/01/2024   OPHTHALMOLOGY EXAM  02/17/2024   Diabetic kidney evaluation - eGFR measurement  03/09/2024   Diabetic kidney evaluation - Urine ACR  03/09/2024    Immunization History  Administered Date(s) Administered   Influenza, Seasonal, Injecte, Preservative Fre 02/17/2023   Influenza,inj,Quad PF,6+ Mos 04/15/2021   Influenza-Unspecified 06/10/2020   PFIZER(Purple Top)SARS-COV-2 Vaccination 07/30/2019, 08/20/2019, 12/09/2021   Zoster Recombinant(Shingrix) 04/06/2020, 09/26/2020   Past Medical History:  Diagnosis Date   Allergy    Anemia    Anxiety    Back pain 04/1992   chronic back pain-diagnose from [place]   Depression    Headache    frequent headache   Hypertension     PTSD (post-traumatic stress disorder)    Thyroid  disease    Uterine fibroid    Social History   Socioeconomic History   Marital status: Widowed    Spouse name: Not on file   Number of children: 1   Years of education: 28   Highest education level: 12th grade  Occupational History   Occupation: child psychotherapist  Tobacco Use   Smoking status: Never    Passive exposure: Never   Smokeless tobacco: Never  Vaping Use   Vaping status: Never Used  Substance and Sexual Activity   Alcohol use: Not Currently    Comment: social-twice a year   Drug use: No   Sexual activity: Not Currently    Partners: Male    Birth control/protection: Surgical  Other Topics Concern   Not on file  Social History Narrative   Son lives nearby and visits daily   Social Drivers of Health   Financial Resource Strain: Low Risk  (01/07/2024)   Overall Financial Resource Strain (CARDIA)    Difficulty of Paying Living Expenses: Not hard at all  Food Insecurity: No Food Insecurity (03/14/2024)   Received from [facility]   Hunger Vital Sign    Within the past 12 months, you worried that your food would run out before you got the money to buy more.: Never true    Within the past 12 months, the food you bought just didn't last and you didn't have money to get more.: Never true  Transportation Needs: No Transportation Needs (03/14/2024)   Received from [facility] - Transportation    Lack of Transportation (Medical):  No    Lack of Transportation (Non-Medical): No  Physical Activity: Sufficiently Active (01/07/2024)   Exercise Vital Sign    Days of Exercise per Week: 7 days    Minutes of Exercise per Session: 30 min  Stress: No Stress Concern Present (01/07/2024)   [employer] - Occupational Stress Questionnaire    Feeling of Stress: Not at all  Social Connections: Moderately Isolated (01/07/2024)   Social Connection and Isolation Panel    Frequency of Communication  with Friends and Family: More than three times a week    Frequency of Social Gatherings with Friends and Family: More than three times a week    Attends Religious Services: More than 4 times per year    Active Member of [employer] or Organizations: No    Attends Banker Meetings: Never    Marital Status: Widowed  Intimate Partner Violence: Not At Risk (01/07/2024)   Humiliation, Afraid, Rape, and Kick questionnaire    Fear of Current or Ex-Partner: No    Emotionally Abused: No    Physically Abused: No    Sexually Abused: No   Past Surgical History:  Procedure Laterality Date   ABDOMINAL HYSTERECTOMY  08/13/2017   left ovary removed   BREAST BIOPSY Bilateral 1888/1191/1994/2001   remove growth from both side breast   ENDOMETRIAL ABLATION     THYROIDECTOMY  08/1999   partial   Family History  Adopted: Yes  Problem Relation Age of Onset   Breast cancer Mother        and 2 other types but don't know what kind   Skin cancer Mother     Current Outpatient Medications:    Biotin w/ Vitamins C & E (HAIR/SKIN/NAILS PO), Take by mouth., Disp: , Rfl:    Blood Glucose Monitoring Suppl DEVI, Check BGs daily E11.9. May substitute to any manufacturer covered by patient's insurance., Disp: 1 each, Rfl: 0   cetirizine (ZYRTEC) 10 MG tablet, Take 10 mg by mouth at bedtime., Disp: , Rfl:    Cholecalciferol (VITAMIN D) 2000 UNITS tablet, Take 4,000 Units by mouth at bedtime. , Disp: , Rfl:    ciclopirox  (PENLAC ) 8 % solution, Apply topically at bedtime. Apply over nail and surrounding skin. Apply daily over previous coat. After seven (7) days, may remove with alcohol and continue cycle., Disp: 6.6 mL, Rfl: 0   clotrimazole  (LOTRIMIN ) 1 % cream, Apply 1 Application topically 2 (two) times daily., Disp: 30 g, Rfl: 0   Efinaconazole  10 % SOLN, Apply 1 drop topically daily., Disp: 4 mL, Rfl: 11   FLUoxetine  (PROZAC ) 20 MG capsule, Take 1 capsule (20 mg total) by mouth daily. (NEEDS TO BE SEEN  BEFORE NEXT REFILL), Disp: 90 capsule, Rfl: 0   Glucose Blood (BLOOD GLUCOSE TEST STRIPS) STRP, Check BGs daily E11.9.May substitute to any manufacturer covered by patient's insurance., Disp: 100 strip, Rfl: 3   Insulin Pen Needle (NOVOFINE PLUS PEN NEEDLE) 32G X 4 MM MISC, UAD to inject Saxenda  daily, Disp: 100 each, Rfl: 3   ketoconazole (NIZORAL) 2 % shampoo, Apply 1 application topically as needed for irritation., Disp: , Rfl:    Lancet Device MISC, Check BGs daily E11.9.May substitute to any manufacturer covered by patient's insurance., Disp: 1 each, Rfl: 0   Lancets Misc. MISC, Check BGs daily E11.9.May substitute to any manufacturer covered by patient's insurance., Disp: 100 each, Rfl: 3   metoprolol  succinate (TOPROL -XL) 25 MG 24 hr tablet, Take 1 tablet (25 mg  total) by mouth daily. (Patient taking differently: Take 12.5 mg by mouth daily.), Disp: 90 tablet, Rfl: 3   naproxen  sodium (ALEVE ) 220 MG tablet, Take 220 mg by mouth daily as needed (Back pain)., Disp: , Rfl:    ondansetron  (ZOFRAN -ODT) 4 MG disintegrating tablet, Take 1 tablet (4 mg total) by mouth every 8 (eight) hours as needed for nausea or vomiting., Disp: 20 tablet, Rfl: 0   polyethylene glycol powder (GLYCOLAX /MIRALAX ) 17 GM/SCOOP powder, Mix 1 capful in 8 ounces of water and drink up to 2 times daily as needed for constipation., Disp: 255 g, Rfl: 1   rosuvastatin  (CRESTOR ) 5 MG tablet, Take 1 tablet (5 mg total) by mouth daily. For cholesterol, Disp: 90 tablet, Rfl: 3   tirzepatide  (MOUNJARO ) 2.5 MG/0.5ML Pen, Inject 2.5 mg into the skin once a week., Disp: 6 mL, Rfl: 0   tretinoin (RETIN-A) 0.05 % cream, Apply 1 application topically 3 (three) times a week. , Disp: , Rfl:    triamterene -hydrochlorothiazide (MAXZIDE) 75-50 MG tablet, Take 0.5 tablets by mouth daily., Disp: 45 tablet, Rfl: 3  Allergies  Allergen Reactions   Latex Other (See Comments)    Discolor-stay for while Adhesive tape - leaves a mark on skin      ROS: Review of Systems {ros; complete:30496}    Physical exam {Exam, Complete:[phone]}      01/07/2024    2:34 PM 06/16/2023    2:11 PM 03/10/2023    2:21 PM  Depression screen PHQ 2/9  Decreased Interest 0 2 0  Down, Depressed, Hopeless 1 2 0  PHQ - 2 Score 1 4 0  Altered sleeping 1 3   Tired, decreased energy 0 3   Change in appetite 0 2   Feeling bad or failure about yourself  1 2   Trouble concentrating 1 2   Moving slowly or fidgety/restless 0 2   Suicidal thoughts 0 2   PHQ-9 Score 4  20    Difficult doing work/chores Not difficult at all Extremely dIfficult      Data saved with a previous flowsheet row definition      06/16/2023    2:11 PM 01/21/2023   11:44 AM 01/06/2023    8:46 AM 09/08/2022    3:43 PM  GAD 7 : Generalized Anxiety Score  Nervous, Anxious, on Edge 3 2 2 3   Control/stop worrying 3 2 3 3   Worry too much - different things 3 3 3 3   Trouble relaxing 3 3 3 3   Restless 3 0 1 3  Easily annoyed or irritable 3 0 1 3  Afraid - awful might happen 3 2 3 3   Total GAD 7 Score 21 12 16 21   Anxiety Difficulty Extremely difficult Somewhat difficult Somewhat difficult Very difficult    No results found for this or any previous visit (from the past 2160 hours).   Assessment/ Plan: Verneita Buerger here for annual physical exam.   Annual physical exam  Diabetes mellitus treated with injections of non-insulin medication (HCC)  Hypertension associated with diabetes (HCC)  Hyperlipidemia associated with type 2 diabetes mellitus (HCC)  Morbid obesity (HCC)  History of partial thyroidectomy  PTSD (post-traumatic stress disorder)  Hepatitis B vaccination status unknown   ***  Counseled on healthy lifestyle choices, including diet (rich in fruits, vegetables and lean meats and low in salt and simple carbohydrates) and exercise (at least 30 minutes of moderate physical activity daily).  Patient to follow up ***  Jayke Caul M. Jolinda, DO

## 2024-05-19 NOTE — Telephone Encounter (Signed)
 Left message for patient to call back to reschedule appointment from 05/09/2024.

## 2024-07-04 ENCOUNTER — Other Ambulatory Visit: Payer: Self-pay | Admitting: Family Medicine

## 2024-07-04 DIAGNOSIS — E119 Type 2 diabetes mellitus without complications: Secondary | ICD-10-CM

## 2025-01-09 ENCOUNTER — Ambulatory Visit: Payer: Self-pay
